# Patient Record
Sex: Male | Born: 1970 | Race: White | Hispanic: No | Smoking: Current every day smoker
Health system: Southern US, Community
[De-identification: ages and names within clinical notes are randomized; demographics above are authoritative.]

## PROBLEM LIST (undated history)

## (undated) DIAGNOSIS — Q249 Congenital malformation of heart, unspecified: Secondary | ICD-10-CM

## (undated) DIAGNOSIS — I471 Supraventricular tachycardia, unspecified: Secondary | ICD-10-CM

## (undated) DIAGNOSIS — F32A Depression, unspecified: Secondary | ICD-10-CM

## (undated) DIAGNOSIS — F419 Anxiety disorder, unspecified: Secondary | ICD-10-CM

## (undated) DIAGNOSIS — K5792 Diverticulitis of intestine, part unspecified, without perforation or abscess without bleeding: Secondary | ICD-10-CM

## (undated) DIAGNOSIS — F329 Major depressive disorder, single episode, unspecified: Secondary | ICD-10-CM

## (undated) HISTORY — DX: Depression, unspecified: F32.A

## (undated) HISTORY — DX: Congenital malformation of heart, unspecified: Q24.9

## (undated) HISTORY — DX: Major depressive disorder, single episode, unspecified: F32.9

## (undated) HISTORY — PX: FOOT SURGERY: SHX648

---

## 1998-10-07 ENCOUNTER — Emergency Department (HOSPITAL_COMMUNITY): Admission: EM | Admit: 1998-10-07 | Discharge: 1998-10-07 | Payer: Self-pay | Admitting: Emergency Medicine

## 2001-08-04 ENCOUNTER — Encounter: Payer: Self-pay | Admitting: Family Medicine

## 2001-08-04 ENCOUNTER — Encounter: Admission: RE | Admit: 2001-08-04 | Discharge: 2001-08-04 | Payer: Self-pay | Admitting: Family Medicine

## 2001-11-17 ENCOUNTER — Encounter (INDEPENDENT_AMBULATORY_CARE_PROVIDER_SITE_OTHER): Payer: Self-pay | Admitting: *Deleted

## 2001-11-17 ENCOUNTER — Ambulatory Visit (HOSPITAL_COMMUNITY): Admission: RE | Admit: 2001-11-17 | Discharge: 2001-11-17 | Payer: Self-pay | Admitting: Gastroenterology

## 2004-03-15 ENCOUNTER — Emergency Department (HOSPITAL_COMMUNITY): Admission: EM | Admit: 2004-03-15 | Discharge: 2004-03-15 | Payer: Self-pay | Admitting: Emergency Medicine

## 2005-09-22 ENCOUNTER — Emergency Department (HOSPITAL_COMMUNITY): Admission: EM | Admit: 2005-09-22 | Discharge: 2005-09-22 | Payer: Self-pay | Admitting: Emergency Medicine

## 2005-11-28 ENCOUNTER — Emergency Department (HOSPITAL_COMMUNITY): Admission: EM | Admit: 2005-11-28 | Discharge: 2005-11-28 | Payer: Self-pay | Admitting: Emergency Medicine

## 2006-04-23 ENCOUNTER — Emergency Department (HOSPITAL_COMMUNITY): Admission: EM | Admit: 2006-04-23 | Discharge: 2006-04-23 | Payer: Self-pay | Admitting: Family Medicine

## 2007-08-17 ENCOUNTER — Encounter: Admission: RE | Admit: 2007-08-17 | Discharge: 2007-08-17 | Payer: Self-pay | Admitting: Family Medicine

## 2007-08-20 ENCOUNTER — Encounter: Admission: RE | Admit: 2007-08-20 | Discharge: 2007-08-20 | Payer: Self-pay | Admitting: Family Medicine

## 2009-01-24 ENCOUNTER — Encounter: Admission: RE | Admit: 2009-01-24 | Discharge: 2009-01-24 | Payer: Self-pay | Admitting: Family Medicine

## 2010-03-24 ENCOUNTER — Emergency Department (HOSPITAL_COMMUNITY)
Admission: EM | Admit: 2010-03-24 | Discharge: 2010-03-25 | Payer: Self-pay | Source: Home / Self Care | Admitting: Emergency Medicine

## 2010-03-30 LAB — POCT I-STAT, CHEM 8
BUN: 14 mg/dL (ref 6–23)
Calcium, Ion: 1.24 mmol/L (ref 1.12–1.32)
Chloride: 106 mEq/L (ref 96–112)
Creatinine, Ser: 1.1 mg/dL (ref 0.4–1.5)
Glucose, Bld: 86 mg/dL (ref 70–99)
HCT: 47 % (ref 39.0–52.0)
Hemoglobin: 16 g/dL (ref 13.0–17.0)
Potassium: 3.9 mEq/L (ref 3.5–5.1)
Sodium: 142 mEq/L (ref 135–145)
TCO2: 26 mmol/L (ref 0–100)

## 2010-03-30 LAB — POCT CARDIAC MARKERS
CKMB, poc: <1 ng/mL — ABNORMAL LOW (ref 1.0–8.0)
Myoglobin, poc: 41.6 ng/mL (ref 12–200)
Troponin i, poc: <0.05 ng/mL (ref 0.00–0.09)

## 2010-05-30 ENCOUNTER — Emergency Department (HOSPITAL_COMMUNITY)
Admission: EM | Admit: 2010-05-30 | Discharge: 2010-05-30 | Disposition: A | Discharge status: Home / Self Care | Payer: Managed Care, Other (non HMO) | Attending: Emergency Medicine | Admitting: Emergency Medicine

## 2010-05-30 ENCOUNTER — Emergency Department (HOSPITAL_COMMUNITY): Payer: Managed Care, Other (non HMO)

## 2010-05-30 DIAGNOSIS — K219 Gastro-esophageal reflux disease without esophagitis: Secondary | ICD-10-CM | POA: Insufficient documentation

## 2010-05-30 DIAGNOSIS — M542 Cervicalgia: Secondary | ICD-10-CM | POA: Insufficient documentation

## 2010-05-30 DIAGNOSIS — R002 Palpitations: Secondary | ICD-10-CM | POA: Insufficient documentation

## 2010-05-30 DIAGNOSIS — Z79899 Other long term (current) drug therapy: Secondary | ICD-10-CM | POA: Insufficient documentation

## 2010-05-30 DIAGNOSIS — R6884 Jaw pain: Secondary | ICD-10-CM | POA: Insufficient documentation

## 2010-05-30 DIAGNOSIS — R000 Tachycardia, unspecified: Secondary | ICD-10-CM | POA: Insufficient documentation

## 2010-05-30 DIAGNOSIS — F411 Generalized anxiety disorder: Secondary | ICD-10-CM | POA: Insufficient documentation

## 2010-05-30 DIAGNOSIS — R079 Chest pain, unspecified: Secondary | ICD-10-CM | POA: Insufficient documentation

## 2010-05-30 LAB — CBC
HCT: 43.6 % (ref 39.0–52.0)
Hemoglobin: 15.2 g/dL (ref 13.0–17.0)
MCH: 30.7 pg (ref 26.0–34.0)
MCHC: 34.9 g/dL (ref 30.0–36.0)
MCV: 88.1 fL (ref 78.0–100.0)
Platelets: 191 10*3/uL (ref 150–400)
RBC: 4.95 MIL/uL (ref 4.22–5.81)
RDW: 12.4 % (ref 11.5–15.5)
WBC: 6.9 10*3/uL (ref 4.0–10.5)

## 2010-05-30 LAB — COMPREHENSIVE METABOLIC PANEL
ALT: 30 U/L (ref 0–53)
AST: 19 U/L (ref 0–37)
Albumin: 3.7 g/dL (ref 3.5–5.2)
Alkaline Phosphatase: 61 U/L (ref 39–117)
BUN: 14 mg/dL (ref 6–23)
CO2: 23 mEq/L (ref 19–32)
Calcium: 8.8 mg/dL (ref 8.4–10.5)
Chloride: 109 mEq/L (ref 96–112)
Creatinine, Ser: 1.07 mg/dL (ref 0.4–1.5)
GFR calc Af Amer: >60 mL/min (ref >60)
GFR calc non Af Amer: >60 mL/min (ref >60)
Glucose, Bld: 137 mg/dL — ABNORMAL HIGH (ref 70–99)
Potassium: 3.9 mEq/L (ref 3.5–5.1)
Sodium: 137 mEq/L (ref 135–145)
Total Bilirubin: 0.5 mg/dL (ref 0.3–1.2)
Total Protein: 6.7 g/dL (ref 6.0–8.3)

## 2010-05-30 LAB — POCT CARDIAC MARKERS
CKMB, poc: <1 ng/mL — ABNORMAL LOW (ref 1.0–8.0)
CKMB, poc: <1 ng/mL — ABNORMAL LOW (ref 1.0–8.0)
CKMB, poc: <1 ng/mL — ABNORMAL LOW (ref 1.0–8.0)
Myoglobin, poc: 45.2 ng/mL (ref 12–200)
Myoglobin, poc: 31.9 ng/mL (ref 12–200)
Myoglobin, poc: 52 ng/mL (ref 12–200)
Troponin i, poc: <0.05 ng/mL (ref 0.00–0.09)
Troponin i, poc: <0.05 ng/mL (ref 0.00–0.09)
Troponin i, poc: <0.05 ng/mL (ref 0.00–0.09)

## 2010-06-15 ENCOUNTER — Emergency Department (HOSPITAL_BASED_OUTPATIENT_CLINIC_OR_DEPARTMENT_OTHER)
Admission: EM | Admit: 2010-06-15 | Discharge: 2010-06-15 | Disposition: A | Discharge status: Home / Self Care | Payer: Managed Care, Other (non HMO) | Attending: Emergency Medicine | Admitting: Emergency Medicine

## 2010-06-15 DIAGNOSIS — K219 Gastro-esophageal reflux disease without esophagitis: Secondary | ICD-10-CM | POA: Insufficient documentation

## 2010-06-15 DIAGNOSIS — L02419 Cutaneous abscess of limb, unspecified: Secondary | ICD-10-CM | POA: Insufficient documentation

## 2010-07-31 NOTE — Op Note (Signed)
NAME:  Jim Hatfield, Jim Hatfield                          ACCOUNT NO.:  [id number]   MEDICAL RECORD NO.:  [id number]                   PATIENT TYPE:  AMB   LOCATION:  ENDO                                 FACILITY:  [facility]   PHYSICIAN:  Charolett Bumpers, M.D.             DATE OF BIRTH:  1971-02-26   DATE OF PROCEDURE:  DATE OF DISCHARGE:  11/17/2001                                 OPERATIVE REPORT   PROCEDURE INDICATION:  The patient is a 40 year-old male born 11-26-1970.  The patient has a four month history of left lower quadrant abdominal pain  (burning and cramps) associated with non-bloody diarrhea.   On Aug 04, 2001 he underwent a CT scan of the abdomen and pelvis which  revealed focal inflammatory changes involving the proximal sigmoid colon  associated with diverticulosis.  The patient has received approximately  three courses of Cipro and Flagyl without completely resolving his  gastrointestinal symptoms.  He is scheduled to undergo a diagnostic  colonoscopy to rule out inflammatory bowel disease.   MEDICATION ALLERGIES:  None.   CHRONIC MEDICATIONS:  None.   PAST MEDICAL HISTORY:  Fractured foot, 1991.   HABITS:  The patient has a one pack per day cigarette smoking habit and does  not consume alcohol.   FAMILY HISTORY:  Negative for inflammatory bowel disease and colorectal  neoplasia.   SOCIAL HISTORY:  The patient is married.  His wife is in good health.  His  two sons and one daughter are in good health.   ENDOSCOPIST:  Charolett Bumpers, M.D.   PREMEDICATION:  Fentanyl 50 mcg and Versed 10 mg.   ENDOSCOPE:  Olympus pediatric colonoscope.   PROCEDURE:  After obtaining informed consent the patient was placed in the  left lateral decubitus position.  I administered intravenous Fentanyl and  intravenous Versed to achieve conscious sedation for the procedure.  The  patient's blood pressure, oxygen saturation and cardiac rhythm were  monitored throughout the procedure  and documented in the medical record.   Anal inspection is normal.  Digital rectal exam was normal.  The Olympus  pediatric video colonoscope was introduced into the rectum and easily  advanced to the cecum.  A normal appearing ileocecal valve was intubated and  the distal ileum inspected.  Colonic preparation for the exam today was  excellent.   Rectum:  Normal.   Sigmoid colon:  Small diverticula are noted.  The mucosa in the sigmoid  colon is red, but is not friable and there are no ulcers.  The sigmoid  mucosa was biopsied multiple times.   Descending colon:  Normal.   Splenic flexure:  Normal.   Transverse colon:  Normal.   Hepatic flexure:  Normal.   Ascending colon:  Normal.   Cecum and ileocecal valve:  Normal.   Distal ileum:  Normal.   ASSESSMENT:  Sigmoid colonic diverticulosis; otherwise normal  proctocolonoscopy  to the cecum with distal ileoscopy.  Sigmoid colonic  biopsies are pending.                                               Charolett Bumpers, M.D.    MKJ/MEDQ  D:  11/17/2001  T:  11/19/2001  Job:  [id number]   cc:   Bryan Lemma. Manus Gunning, M.D.  [address] [place]  [place] [address]  Fax: [phone]

## 2013-01-25 ENCOUNTER — Emergency Department (HOSPITAL_COMMUNITY): Payer: Self-pay

## 2013-01-25 ENCOUNTER — Encounter (HOSPITAL_COMMUNITY): Payer: Self-pay | Admitting: Emergency Medicine

## 2013-01-25 ENCOUNTER — Emergency Department (HOSPITAL_COMMUNITY)
Admission: EM | Admit: 2013-01-25 | Discharge: 2013-01-25 | Disposition: A | Discharge status: Home / Self Care | Payer: Self-pay | Attending: Emergency Medicine | Admitting: Emergency Medicine

## 2013-01-25 DIAGNOSIS — F17200 Nicotine dependence, unspecified, uncomplicated: Secondary | ICD-10-CM | POA: Insufficient documentation

## 2013-01-25 DIAGNOSIS — F411 Generalized anxiety disorder: Secondary | ICD-10-CM | POA: Insufficient documentation

## 2013-01-25 DIAGNOSIS — R002 Palpitations: Secondary | ICD-10-CM | POA: Insufficient documentation

## 2013-01-25 HISTORY — DX: Anxiety disorder, unspecified: F41.9

## 2013-01-25 LAB — BASIC METABOLIC PANEL
BUN: 12 mg/dL (ref 6–23)
CO2: 26 mEq/L (ref 19–32)
Calcium: 10.2 mg/dL (ref 8.4–10.5)
Chloride: 103 mEq/L (ref 96–112)
Creatinine, Ser: 1.01 mg/dL (ref 0.50–1.35)
GFR calc Af Amer: >90 mL/min (ref >90)
GFR calc non Af Amer: 89 mL/min — ABNORMAL LOW (ref >90)
Glucose, Bld: 98 mg/dL (ref 70–99)
Potassium: 3.8 mEq/L (ref 3.5–5.1)
Sodium: 139 mEq/L (ref 135–145)

## 2013-01-25 LAB — CBC WITH DIFFERENTIAL/PLATELET
Basophils Absolute: 0.1 10*3/uL (ref 0.0–0.1)
Basophils Relative: 1 % (ref 0–1)
Eosinophils Absolute: 0.4 10*3/uL (ref 0.0–0.7)
Eosinophils Relative: 3 % (ref 0–5)
HCT: 46.9 % (ref 39.0–52.0)
Hemoglobin: 16.5 g/dL (ref 13.0–17.0)
Lymphocytes Relative: 20 % (ref 12–46)
Lymphs Abs: 2.3 10*3/uL (ref 0.7–4.0)
MCH: 32.2 pg (ref 26.0–34.0)
MCHC: 35.2 g/dL (ref 30.0–36.0)
MCV: 91.6 fL (ref 78.0–100.0)
Monocytes Absolute: 0.8 10*3/uL (ref 0.1–1.0)
Monocytes Relative: 7 % (ref 3–12)
Neutro Abs: 7.8 10*3/uL — ABNORMAL HIGH (ref 1.7–7.7)
Neutrophils Relative %: 69 % (ref 43–77)
Platelets: 202 10*3/uL (ref 150–400)
RBC: 5.12 MIL/uL (ref 4.22–5.81)
RDW: 12.3 % (ref 11.5–15.5)
WBC: 11.4 10*3/uL — ABNORMAL HIGH (ref 4.0–10.5)

## 2013-01-25 LAB — POCT I-STAT TROPONIN I: Troponin i, poc: 0 ng/mL (ref 0.00–0.08)

## 2013-01-25 MED ORDER — DIAZEPAM 5 MG PO TABS
5.0000 mg | ORAL_TABLET | Freq: Once | ORAL | Status: AC
  Administered 2013-01-25: 5 mg via ORAL
  Filled 2013-01-25: qty 1

## 2013-01-25 NOTE — ED Provider Notes (Signed)
CSN: [id number]     Arrival date & time 01/25/13  1911 History   First MD Initiated Contact with Patient 01/25/13 1938     Chief Complaint  Patient presents with  . Tachycardia   (Consider location/radiation/quality/duration/timing/severity/associated sxs/prior Treatment) The history is provided by the patient and medical records.   This is a 42 year old male with past medical history significant for anxiety and SVT, presenting to the ED for tachycardia patient states he was sitting at home watching television when he had sudden onset of flushed feeling across his face and palpitations. States he checked his blood pressure using his home machine and pulse was noted to be 198, called EMS. Patient states he took 2 of his 10 mg Valium prior to EMS arrival. On EMS arrival, heart rate was 104 and blood pressure was 140/96.  Patient notes he has been having some intermittent chest tightness over the past 3 days. States he originally attributed this to his anxiety, and often times he has a hard time distinguishing between anxiety and possible SVT. Patient's last evaluation with cardiology was several years ago, states after diagnosis with SVT by event monitor, he had multiple stress tests all of which were negative and he was discharged from the practice.  On arrival to the ED, vital signs are stable patient alert and oriented, no acute distress.  Past Medical History  Diagnosis Date  . Anxiety    History reviewed. No pertinent past surgical history. History reviewed. No pertinent family history. History  Substance Use Topics  . Smoking status: Current Every Day Smoker  . Smokeless tobacco: Never Used  . Alcohol Use: Yes    Review of Systems  Cardiovascular: Positive for palpitations.  All other systems reviewed and are negative.    Allergies  Ativan  Home Medications  No current outpatient prescriptions on file. BP 142/93  Pulse 92  Temp(Src) 98.3 F (36.8 C) (Oral)  Resp 14  SpO2  97%  Physical Exam  Nursing note and vitals reviewed. Constitutional: He is oriented to person, place, and time. He appears well-developed and well-nourished. No distress.  HENT:  Head: Normocephalic and atraumatic.  Mouth/Throat: Oropharynx is clear and moist.  Eyes: Conjunctivae and EOM are normal. Pupils are equal, round, and reactive to light.  Neck: Normal range of motion. Neck supple.  Cardiovascular: Normal rate, regular rhythm and normal heart sounds.   Pulmonary/Chest: Effort normal and breath sounds normal. No respiratory distress. He has no wheezes.  Abdominal: Soft. Bowel sounds are normal. There is no tenderness. There is no guarding.  Musculoskeletal: Normal range of motion. He exhibits no edema.  Neurological: He is alert and oriented to person, place, and time.  Skin: Skin is warm and dry. He is not diaphoretic.  Psychiatric: His mood appears anxious.  Appears somewhat anxious    ED Course  Procedures (including critical care time)   Date: 01/25/2013  Rate: 94  Rhythm: normal sinus rhythm  QRS Axis: normal  Intervals: normal  ST/T Wave abnormalities: normal  Conduction Disutrbances:none  Narrative Interpretation:   Old EKG Reviewed: unchanged   Labs Review Labs Reviewed  CBC WITH DIFFERENTIAL - Abnormal; Notable for the following:    WBC 11.4 (*)    Neutro Abs 7.8 (*)    All other components within normal limits  BASIC METABOLIC PANEL - Abnormal; Notable for the following:    GFR calc non Af Amer 89 (*)    All other components within normal limits  POCT I-STAT TROPONIN I  Imaging Review Dg Chest 2 View  01/25/2013   CLINICAL DATA:  Chest pain. Shortness of breath. Tachycardia. Smoker.  EXAM: CHEST  2 VIEW  COMPARISON:  05/30/2010.  FINDINGS: The cardiac silhouette remains near the upper limit of normal in size. Clear lungs. Mild diffuse peribronchial thickening without significant change. Minimal scoliosis. Mild thoracic spine degenerative changes.   IMPRESSION: Stable borderline cardiomegaly and mild chronic bronchitic changes. No acute abnormality.   Electronically Signed   By: Gordan Payment M.D.   On: 01/25/2013 21:27    EKG Interpretation   None       MDM   1. Palpitations    Rhythm strips from EMS reviewed-- sinus tach with rate of 101, no ST elevation.  EKG in the ED NSR, no acute ischemic changes.  Trop negative.  CXR clear.  Labs largely WNL.  Pt has remained in NSR during entire ED stay.  Pt was re-evaluated, states he feels fine but is fearful that he will have another SVT episode.  I will refer him to back to cardiology for repeat evaluation if desired.  Discussed lab and imaging results with pt and family, they acknowledged understanding and agreed.  Strict return precautions advised should sx change or worsen.  VS stable at time of discharge. Filed Vitals:   01/25/13 2131  BP: 125/79  Pulse: 79  Temp:   Resp: 12     Garlon Hatchet, PA-C 01/26/13 0001

## 2013-01-25 NOTE — ED Notes (Signed)
Pt reports taking a 10mg  dose of Valium around 1820

## 2013-01-25 NOTE — ED Notes (Signed)
Per EMS: pt coming from home with c/o tachycardia. Pt reports at 1815 a sudden onset of feeling flush from the chest up, home BP reported pulse of 198. Pt took valium x 2 prior to EMS arrival. Upon EMS arrival pt's skin was warm and dry, 104HR NSR, 140/96, 98% RA, A&Ox4, equal and unlabored. Pt reports intermittent chest tightness over the past three days.

## 2013-01-28 NOTE — ED Provider Notes (Signed)
Medical screening examination/treatment/procedure(s) were performed by non-physician practitioner and as supervising physician I was immediately available for consultation/collaboration.  EKG Interpretation     Ventricular Rate:  94 PR Interval:  152 QRS Duration: 87 QT Interval:  320 QTC Calculation: 400 R Axis:   53 Text Interpretation:  Sinus rhythm ED PHYSICIAN INTERPRETATION AVAILABLE IN [facility]              Malena Timpone B. Bernette Mayers, MD 01/28/13 [phone]

## 2013-03-01 ENCOUNTER — Emergency Department (HOSPITAL_COMMUNITY): Payer: Self-pay

## 2013-03-01 ENCOUNTER — Emergency Department (HOSPITAL_COMMUNITY)
Admission: EM | Admit: 2013-03-01 | Discharge: 2013-03-01 | Disposition: A | Discharge status: Home / Self Care | Payer: Self-pay | Attending: Emergency Medicine | Admitting: Emergency Medicine

## 2013-03-01 ENCOUNTER — Encounter (HOSPITAL_COMMUNITY): Payer: Self-pay | Admitting: Emergency Medicine

## 2013-03-01 DIAGNOSIS — F17200 Nicotine dependence, unspecified, uncomplicated: Secondary | ICD-10-CM | POA: Insufficient documentation

## 2013-03-01 DIAGNOSIS — Z792 Long term (current) use of antibiotics: Secondary | ICD-10-CM | POA: Insufficient documentation

## 2013-03-01 DIAGNOSIS — R000 Tachycardia, unspecified: Secondary | ICD-10-CM | POA: Insufficient documentation

## 2013-03-01 DIAGNOSIS — K5732 Diverticulitis of large intestine without perforation or abscess without bleeding: Secondary | ICD-10-CM | POA: Insufficient documentation

## 2013-03-01 DIAGNOSIS — R002 Palpitations: Secondary | ICD-10-CM | POA: Insufficient documentation

## 2013-03-01 DIAGNOSIS — Z8679 Personal history of other diseases of the circulatory system: Secondary | ICD-10-CM | POA: Insufficient documentation

## 2013-03-01 DIAGNOSIS — K5792 Diverticulitis of intestine, part unspecified, without perforation or abscess without bleeding: Secondary | ICD-10-CM

## 2013-03-01 DIAGNOSIS — F411 Generalized anxiety disorder: Secondary | ICD-10-CM | POA: Insufficient documentation

## 2013-03-01 DIAGNOSIS — Z79899 Other long term (current) drug therapy: Secondary | ICD-10-CM | POA: Insufficient documentation

## 2013-03-01 HISTORY — DX: Supraventricular tachycardia: I47.1

## 2013-03-01 HISTORY — DX: Diverticulitis of intestine, part unspecified, without perforation or abscess without bleeding: K57.92

## 2013-03-01 HISTORY — DX: Supraventricular tachycardia, unspecified: I47.10

## 2013-03-01 LAB — CBC WITH DIFFERENTIAL/PLATELET
Basophils Absolute: 0.1 10*3/uL (ref 0.0–0.1)
Basophils Relative: 1 % (ref 0–1)
Eosinophils Relative: 2 % (ref 0–5)
HCT: 46 % (ref 39.0–52.0)
Hemoglobin: 16.2 g/dL (ref 13.0–17.0)
Lymphocytes Relative: 16 % (ref 12–46)
Lymphs Abs: 1.6 10*3/uL (ref 0.7–4.0)
MCV: 89.7 fL (ref 78.0–100.0)
Monocytes Absolute: 0.8 10*3/uL (ref 0.1–1.0)
Monocytes Relative: 7 % (ref 3–12)
Neutro Abs: 7.6 10*3/uL (ref 1.7–7.7)
Neutrophils Relative %: 75 % (ref 43–77)
RBC: 5.13 MIL/uL (ref 4.22–5.81)
WBC: 10.2 10*3/uL (ref 4.0–10.5)

## 2013-03-01 LAB — COMPREHENSIVE METABOLIC PANEL
ALT: 17 U/L (ref 0–53)
AST: 12 U/L (ref 0–37)
Albumin: 4 g/dL (ref 3.5–5.2)
Alkaline Phosphatase: 81 U/L (ref 39–117)
BUN: 12 mg/dL (ref 6–23)
CO2: 23 mEq/L (ref 19–32)
Chloride: 105 mEq/L (ref 96–112)
Creatinine, Ser: 0.92 mg/dL (ref 0.50–1.35)
GFR calc non Af Amer: >90 mL/min (ref >90)
Glucose, Bld: 100 mg/dL — ABNORMAL HIGH (ref 70–99)
Potassium: 3.8 mEq/L (ref 3.5–5.1)
Total Bilirubin: 0.3 mg/dL (ref 0.3–1.2)

## 2013-03-01 LAB — LIPASE, BLOOD: Lipase: 24 U/L (ref 11–59)

## 2013-03-01 MED ORDER — SODIUM CHLORIDE 0.9 % IV BOLUS (SEPSIS)
1000.0000 mL | Freq: Once | INTRAVENOUS | Status: AC
  Administered 2013-03-01: 1000 mL via INTRAVENOUS

## 2013-03-01 MED ORDER — ONDANSETRON HCL 4 MG PO TABS: 4.0000 mg | ORAL_TABLET | Freq: Four times a day (QID) | ORAL | Status: DC

## 2013-03-01 MED ORDER — METRONIDAZOLE 500 MG PO TABS
500.0000 mg | ORAL_TABLET | Freq: Once | ORAL | Status: AC
  Administered 2013-03-01: 500 mg via ORAL
  Filled 2013-03-01: qty 1

## 2013-03-01 MED ORDER — CIPROFLOXACIN HCL 500 MG PO TABS
500.0000 mg | ORAL_TABLET | Freq: Once | ORAL | Status: AC
  Administered 2013-03-01: 500 mg via ORAL
  Filled 2013-03-01: qty 1

## 2013-03-01 MED ORDER — CIPROFLOXACIN HCL 500 MG PO TABS: 500.0000 mg | ORAL_TABLET | Freq: Two times a day (BID) | ORAL | Status: DC

## 2013-03-01 MED ORDER — MORPHINE SULFATE 4 MG/ML IJ SOLN
4.0000 mg | Freq: Once | INTRAMUSCULAR | Status: AC
  Administered 2013-03-01: 4 mg via INTRAVENOUS
  Filled 2013-03-01: qty 1

## 2013-03-01 MED ORDER — IOHEXOL 300 MG/ML  SOLN
50.0000 mL | Freq: Once | INTRAMUSCULAR | Status: AC | PRN
  Administered 2013-03-01: 50 mL via ORAL

## 2013-03-01 MED ORDER — DIAZEPAM 5 MG/ML IJ SOLN
5.0000 mg | Freq: Once | INTRAMUSCULAR | Status: AC
  Administered 2013-03-01: 5 mg via INTRAVENOUS
  Filled 2013-03-01: qty 2

## 2013-03-01 MED ORDER — OXYCODONE-ACETAMINOPHEN 5-325 MG PO TABS: 1.0000 | ORAL_TABLET | Freq: Four times a day (QID) | ORAL | Status: DC | PRN

## 2013-03-01 MED ORDER — METRONIDAZOLE 500 MG PO TABS: 500.0000 mg | ORAL_TABLET | Freq: Three times a day (TID) | ORAL | Status: DC

## 2013-03-01 MED ORDER — ONDANSETRON HCL 4 MG/2ML IJ SOLN
4.0000 mg | Freq: Once | INTRAMUSCULAR | Status: AC
  Administered 2013-03-01: 4 mg via INTRAVENOUS
  Filled 2013-03-01: qty 2

## 2013-03-01 MED ORDER — IOHEXOL 300 MG/ML  SOLN
100.0000 mL | Freq: Once | INTRAMUSCULAR | Status: AC | PRN
  Administered 2013-03-01: 100 mL via INTRAVENOUS

## 2013-03-01 NOTE — ED Notes (Signed)
Pt states he has had several episodes of SVT over past few months. Pt states he is having palpitations again. HR varies between 110 and 145 in triage. Pt alert and oriented. Pt denies lightheadedness. Also has had constipated for past 2 weeks.

## 2013-03-01 NOTE — ED Provider Notes (Signed)
Medical screening examination/treatment/procedure(s) were performed by non-physician practitioner and as supervising physician I was immediately available for consultation/collaboration.  EKG Interpretation    Date/Time:  Thursday March 01 2013 18:10:04 EST Ventricular Rate:  107 PR Interval:  151 QRS Duration: 87 QT Interval:  307 QTC Calculation: 409 R Axis:   76 Text Interpretation:  Sinus tachycardia Borderline repolarization abnormality Baseline wander in lead(s) I III aVL No significant change was found Confirmed by CAMPOS  MD, KEVIN ([id number]) on 03/01/2013 6:44:42 PM              Richardean Canal, MD 03/01/13 [phone]

## 2013-03-01 NOTE — ED Notes (Signed)
Patient is alert and oriented x3.  He was given DC instructions and follow up visit instructions.  Patient gave verbal understanding.  He was DC ambulatory under his own power to home.  V/S stable.  He was not showing any signs of distress on DC 

## 2013-03-01 NOTE — ED Provider Notes (Signed)
CSN: [id number]     Arrival date & time 03/01/13  1806 History   First MD Initiated Contact with Patient 03/01/13 1835     Chief Complaint  Patient presents with  . Palpitations  . Diverticulitis   (Consider location/radiation/quality/duration/timing/severity/associated sxs/prior Treatment) HPI  Patient presents to the ED complaining of RLQ abdominal pain and anxiety/palpiations. He states that he had SVT 5 years ago and sometimes when he has panic attacks he can't tell if its the panic or the SVT. He is feeling anxious right now and has not taken his valium today. He says that his LLQ has been bothering him for the past two weeks and becoming increasingly worse. He has known diverticulosis but also reports having bought's of diverticulitis. Reports this feels just like the same. Denies nausea, vomiting, and fevers. + constipation.  Past Medical History  Diagnosis Date  . Anxiety   . Diverticulitis   . SVT (supraventricular tachycardia)    History reviewed. No pertinent past surgical history. History reviewed. No pertinent family history. History  Substance Use Topics  . Smoking status: Current Every Day Smoker  . Smokeless tobacco: Never Used  . Alcohol Use: Yes    Review of Systems The patient denies anorexia, fever, weight loss,, vision loss, decreased hearing, hoarseness, chest pain, syncope, dyspnea on exertion, peripheral edema, balance deficits, hemoptysis, , melena, hematochezia, severe indigestion/heartburn, hematuria, incontinence, genital sores, muscle weakness, suspicious skin lesions, transient blindness, difficulty walking, depression, unusual weight change, abnormal bleeding, enlarged lymph nodes, angioedema, and breast masses.  Allergies  Ativan  Home Medications   Current Outpatient Rx  Name  Route  Sig  Dispense  Refill  . clonazePAM (KLONOPIN) 0.5 MG tablet   Oral   Take 0.5 mg by mouth 3 (three) times daily as needed for anxiety.         . diazepam  (VALIUM) 10 MG tablet   Oral   Take 10 mg by mouth every 12 (twelve) hours as needed for anxiety.          Marland Kitchen omeprazole (PRILOSEC) 20 MG capsule   Oral   Take 20 mg by mouth daily.         . ciprofloxacin (CIPRO) 500 MG tablet   Oral   Take 1 tablet (500 mg total) by mouth 2 (two) times daily.   20 tablet   0   . metroNIDAZOLE (FLAGYL) 500 MG tablet   Oral   Take 1 tablet (500 mg total) by mouth 3 (three) times daily.   30 tablet   0   . ondansetron (ZOFRAN) 4 MG tablet   Oral   Take 1 tablet (4 mg total) by mouth every 6 (six) hours.   12 tablet   0   . oxyCODONE-acetaminophen (PERCOCET/ROXICET) 5-325 MG per tablet   Oral   Take 1 tablet by mouth every 6 (six) hours as needed for severe pain.   15 tablet   0    BP 140/92  Pulse 95  Temp(Src) 98.3 F (36.8 C) (Oral)  Resp 17  SpO2 97% Physical Exam  Nursing note and vitals reviewed. Constitutional: He appears well-developed and well-nourished. No distress.  HENT:  Head: Normocephalic and atraumatic.  Eyes: Pupils are equal, round, and reactive to light.  Neck: Normal range of motion. Neck supple.  Cardiovascular: Regular rhythm.  Tachycardia present.   Pulmonary/Chest: Effort normal.  Abdominal: Soft. There is tenderness in the left lower quadrant. There is guarding. There is no rigidity, no rebound, no  CVA tenderness, no tenderness at McBurney's point and negative Murphy's sign.    Neurological: He is alert.  Skin: Skin is warm and dry.    ED Course  Procedures (including critical care time) Labs Review Labs Reviewed  COMPREHENSIVE METABOLIC PANEL - Abnormal; Notable for the following:    Glucose, Bld 100 (*)    All other components within normal limits  CBC WITH DIFFERENTIAL  LIPASE, BLOOD   Imaging Review Ct Abdomen Pelvis W Contrast  03/01/2013   CLINICAL DATA:  Left lower quadrant pain question diverticulitis  EXAM: CT ABDOMEN AND PELVIS WITH CONTRAST  TECHNIQUE: Multidetector CT imaging of  the abdomen and pelvis was performed using the standard protocol following bolus administration of intravenous contrast.  CONTRAST:  50mL OMNIPAQUE IOHEXOL 300 MG/ML SOLN orally, OMNIPAQUE IOHEXOL 300 MG/ML SOLN IV  COMPARISON:  01/24/2009  FINDINGS: Minimal dependent atelectasis left lower lobe.  Small hepatic cyst superiorly 10 mm diameter image 11 unchanged.  Liver, spleen, pancreas, and kidneys normal appearance.  Small nodules in the adrenal glands bilaterally likely adenomas, largest 13 mm diameter on right, image 23.  Diverticulosis of the descending and sigmoid colon with minimal focal wall thickening and pericolic inflammatory changes at the proximal sigmoid colon compatible with mild acute diverticulitis.  No evidence of perforation or abscess.  Normal appendix.  Unremarkable bladder and ureters.  Stomach and bowel loops otherwise normal appearance.  No mass, adenopathy, free fluid or hernia.  Osseous structures unremarkable.  IMPRESSION: Mild acute diverticulitis at the proximal sigmoid colon.  Tiny bilateral probable adrenal adenomas.   Electronically Signed   By: Ulyses Southward M.D.   On: 03/01/2013 21:38    EKG Interpretation    Date/Time:  Thursday March 01 2013 18:10:04 EST Ventricular Rate:  107 PR Interval:  151 QRS Duration: 87 QT Interval:  307 QTC Calculation: 409 R Axis:   76 Text Interpretation:  Sinus tachycardia Borderline repolarization abnormality Baseline wander in lead(s) I III aVL No significant change was found Confirmed by CAMPOS  MD, KEVIN ([id number]) on 03/01/2013 6:44:42 PM            MDM   1. Diverticulitis      Patient hydrated, pain medication given Labs and CT abd/pelv pending.  Labs are reassuring and unremarkable CT scan of the abdomen shows mild acute diverticulitis.  Pain has been managed in the ED, pt looks well and vitals are stable. He is a good candidate to be treated at home. Pt is agreeable and comfortable with plan.   42  y.o.Jim Hatfield's evaluation in the Emergency Department is complete. It has been determined that no acute conditions requiring further emergency intervention are present at this time. The patient/guardian have been advised of the diagnosis and plan. We have discussed signs and symptoms that warrant return to the ED, such as changes or worsening in symptoms.  Vital signs are stable at discharge. Filed Vitals:   03/01/13 1849  BP: 140/92  Pulse: 95  Temp:   Resp: 17    Patient/guardian has voiced understanding and agreed to follow-up with the PCP or specialist. \    Dorthula Matas, PA-C 03/01/13 2156

## 2014-10-17 ENCOUNTER — Emergency Department (HOSPITAL_COMMUNITY)
Admission: EM | Admit: 2014-10-17 | Discharge: 2014-10-17 | Disposition: A | Discharge status: Home / Self Care | Payer: Managed Care, Other (non HMO) | Attending: Physician Assistant | Admitting: Physician Assistant

## 2014-10-17 ENCOUNTER — Emergency Department (HOSPITAL_COMMUNITY): Payer: Managed Care, Other (non HMO)

## 2014-10-17 ENCOUNTER — Encounter (HOSPITAL_COMMUNITY): Payer: Self-pay | Admitting: Emergency Medicine

## 2014-10-17 DIAGNOSIS — I493 Ventricular premature depolarization: Secondary | ICD-10-CM

## 2014-10-17 DIAGNOSIS — F419 Anxiety disorder, unspecified: Secondary | ICD-10-CM | POA: Insufficient documentation

## 2014-10-17 DIAGNOSIS — Z8719 Personal history of other diseases of the digestive system: Secondary | ICD-10-CM | POA: Insufficient documentation

## 2014-10-17 DIAGNOSIS — Z720 Tobacco use: Secondary | ICD-10-CM | POA: Insufficient documentation

## 2014-10-17 LAB — I-STAT TROPONIN, ED: Troponin i, poc: 0 ng/mL (ref 0.00–0.08)

## 2014-10-17 LAB — BASIC METABOLIC PANEL
Anion gap: 6 (ref 5–15)
BUN: 15 mg/dL (ref 6–20)
CO2: 25 mmol/L (ref 22–32)
Calcium: 9.2 mg/dL (ref 8.9–10.3)
Chloride: 107 mmol/L (ref 101–111)
Creatinine, Ser: 1.03 mg/dL (ref 0.61–1.24)
GFR calc non Af Amer: >60 mL/min (ref >60)
GLUCOSE: 157 mg/dL — AB (ref 65–99)
POTASSIUM: 3.8 mmol/L (ref 3.5–5.1)
SODIUM: 138 mmol/L (ref 135–145)

## 2014-10-17 LAB — CBC
HCT: 46.1 % (ref 39.0–52.0)
HEMOGLOBIN: 15.5 g/dL (ref 13.0–17.0)
MCH: 31.1 pg (ref 26.0–34.0)
MCHC: 33.6 g/dL (ref 30.0–36.0)
MCV: 92.6 fL (ref 78.0–100.0)
Platelets: 181 10*3/uL (ref 150–400)
RBC: 4.98 MIL/uL (ref 4.22–5.81)
RDW: 12.9 % (ref 11.5–15.5)
WBC: 8.1 10*3/uL (ref 4.0–10.5)

## 2014-10-17 MED ORDER — NITROGLYCERIN 0.4 MG SL SUBL
0.4000 mg | SUBLINGUAL_TABLET | SUBLINGUAL | Status: DC | PRN
Start: 2014-10-17 — End: 2014-10-17

## 2014-10-17 MED ORDER — ASPIRIN 81 MG PO CHEW
324.0000 mg | CHEWABLE_TABLET | Freq: Once | ORAL | Status: AC
  Administered 2014-10-17: 324 mg via ORAL
  Filled 2014-10-17: qty 4

## 2014-10-17 MED ORDER — DIAZEPAM 5 MG/ML IJ SOLN
2.5000 mg | Freq: Once | INTRAMUSCULAR | Status: AC
  Administered 2014-10-17: 2.5 mg via INTRAVENOUS
  Filled 2014-10-17: qty 2

## 2014-10-17 NOTE — ED Provider Notes (Signed)
CSN: [id number]     Arrival date & time 10/17/14  1054 History   First MD Initiated Contact with Patient 10/17/14 1056     Chief Complaint  Patient presents with  . Palpitations     (Consider location/radiation/quality/duration/timing/severity/associated sxs/prior Treatment) HPI Comments: Jim Hatfield, 44 y/o male with history of anxiety and SVT presents with palpitations. The palpitations began about an hour ago. He has a history of developing palpitations, becoming very anxious and occasionally having anxiety attacks, and developing SVT. He does not think it is related to exertion. This time, he attempted a valsalva maneuver without relief and took the valium prescribed to him for anxiety. He states it is a viscous cycle and he is fighting off an anxiety attack right now. He denies chest pain, nausea, headaches, dizziness, fevers, chills.   Patient is a 44 y.o. male presenting with palpitations. The history is provided by the patient.  Palpitations Palpitations quality:  Fast Onset quality:  Sudden Duration:  1 hour Chronicity:  Recurrent Context: anxiety   Relieved by:  Nothing Ineffective treatments:  Valsalva   Past Medical History  Diagnosis Date  . Anxiety   . Diverticulitis   . SVT (supraventricular tachycardia)    Past Surgical History  Procedure Laterality Date  . Foot surgery Left    No family history on file. History  Substance Use Topics  . Smoking status: Current Every Day Smoker -- 0.50 packs/day  . Smokeless tobacco: Never Used  . Alcohol Use: Yes     Comment: socially    Review of Systems  Cardiovascular: Positive for palpitations.  All other systems reviewed and are negative.     Allergies  Ativan  Home Medications   Prior to Admission medications   Medication Sig Start Date End Date Taking? Authorizing Provider  diazepam (VALIUM) 10 MG tablet Take 10 mg by mouth every 12 (twelve) hours as needed for anxiety.    Yes Historical Provider, MD   ciprofloxacin (CIPRO) 500 MG tablet Take 1 tablet (500 mg total) by mouth 2 (two) times daily. Patient not taking: Reported on 10/17/2014 03/01/13   Marlon Pel, PA-C  metroNIDAZOLE (FLAGYL) 500 MG tablet Take 1 tablet (500 mg total) by mouth 3 (three) times daily. Patient not taking: Reported on 10/17/2014 03/01/13   Marlon Pel, PA-C  ondansetron (ZOFRAN) 4 MG tablet Take 1 tablet (4 mg total) by mouth every 6 (six) hours. Patient not taking: Reported on 10/17/2014 03/01/13   Marlon Pel, PA-C  oxyCODONE-acetaminophen (PERCOCET/ROXICET) 5-325 MG per tablet Take 1 tablet by mouth every 6 (six) hours as needed for severe pain. Patient not taking: Reported on 10/17/2014 03/01/13   Marlon Pel, PA-C   BP 155/73 mmHg  Pulse 98  Temp(Src) 98.4 F (36.9 C) (Oral)  Resp 17  SpO2 96% Physical Exam  Constitutional: He is oriented to person, place, and time. He appears well-developed and well-nourished.  HENT:  Head: Normocephalic.  Eyes: Conjunctivae and EOM are normal. Pupils are equal, round, and reactive to light.  Cardiovascular: Intact distal pulses.   Minimally tachycardic around 100 bpm Irregular rhythm  Pulmonary/Chest: Effort normal and breath sounds normal. He has no wheezes. He has no rales.  Abdominal: Soft. He exhibits no distension. There is no tenderness.  Musculoskeletal: Normal range of motion. He exhibits no edema or tenderness.  Neurological: He is alert and oriented to person, place, and time. No cranial nerve deficit. He exhibits normal muscle tone. Coordination normal.  Skin: He is not diaphoretic.  Psychiatric: He has a normal mood and affect.    ED Course  Procedures (including critical care time) Labs Review Labs Reviewed  BASIC METABOLIC PANEL - Abnormal; Notable for the following:    Glucose, Bld 157 (*)    All other components within normal limits  CBC  I-STAT TROPOININ, ED    Imaging Review Dg Chest Port 1 View  10/17/2014   CLINICAL DATA:   Irregular heartbeat  EXAM: PORTABLE CHEST - 1 VIEW  COMPARISON:  01/25/2013  FINDINGS: Normal heart size and mediastinal contours. Chronic bronchitic markings. Accounting for low volumes, no acute infiltrate or edema. No effusion or pneumothorax. No acute osseous findings.  IMPRESSION: Stable low volume chest.   Electronically Signed   By: Marnee Spring M.D.   On: 10/17/2014 11:36     EKG Interpretation None      MDM   Final diagnoses:  PVCs (premature ventricular contractions)    Patient with pvcs Hear score of 1.  No SVT. Neg troponin, cxr Appears safe for discharge at this time. I spoke with Dr. Corlis Leak who agrees with plan of care.      Arthor Captain, PA-C 10/17/14 1504  Courteney Randall An, MD 10/18/14 [phone]

## 2014-10-17 NOTE — Discharge Instructions (Signed)
Premature Beats °A premature beat is an extra heartbeat that happens earlier than normal. Premature beats are called premature atrial contractions (PACs) or premature ventricular contractions (PVCs) depending on the area of the heart where they start. °CAUSES  °Premature beats may be brought on by a variety of factors including: °· Emotional stress. °· Lack of sleep. °· Caffeine. °· Asthma medicines. °· Stimulants. °· Herbal teas. °· Dietary supplements. °· Alcohol. °In most cases, premature beats are not dangerous and are not a sign of serious heart disease. Most patients evaluated for premature beats have completely normal heart function. Rarely, premature beats may be a sign of more significant heart problems or medical illness. °SYMPTOMS  °Premature beats may cause palpitations. This means you feel like your heart is skipping a beat or beating harder than usual. Sometimes, slight chest pain occurs with premature beats, lasting only a few seconds. This pain has been described as a "flopping" feeling inside the chest. In many cases, premature beats do not cause any symptoms and they are only detected when an electrocardiography test (EKG) or heart monitoring is performed. °DIAGNOSIS  °Your caregiver may run some tests to evaluate your heart such as an EKG or echocardiography. You may need to wear a portable heart monitor for several days to record the electrical activity of your heart. Blood testing may also be performed to check your electrolytes and thyroid function. °TREATMENT  °Premature beats usually go away with rest. If the problem continues, your caregiver will determine a treatment plan for you.  °HOME CARE INSTRUCTIONS °· Get plenty of rest over the next few days until your symptoms improve. °· Avoid coffee, tea, alcohol, and soda (pop, cola). °· Do not smoke. °SEEK MEDICAL CARE IF: °· Your symptoms continue after 1 to 2 days of rest. °· You have new symptoms, such as chest pain or trouble  breathing. °SEEK IMMEDIATE MEDICAL CARE IF: °· You have severe chest pain or abdominal pain. °· You have pain that radiates into the neck, arm, or jaw. °· You faint or have extreme weakness. °· You have shortness of breath. °· Your heartbeat races for more than 5 seconds. °MAKE SURE YOU: °· Understand these instructions. °· Will watch your condition. °· Will get help right away if you are not doing well or get worse. °Document Released: 04/08/2004 Document Revised: 05/24/2011 Document Reviewed: 11/02/2010 °ExitCare® Patient Information ©2015 [employer]. This information is not intended to replace advice given to you by your health care provider. Make sure you discuss any questions you have with your health care provider. ° °Holter Monitoring °A Holter monitor is a small device with electrodes (small sticky patches) that attach to your chest. It records the electrical activity of your heart and is worn continuously for 24-48 hours.  °A HOLTER MONITOR IS USED TO °· Detect heart problems such as: °¨ Heart arrhythmia. Is an abnormal or irregular heartbeat. With some heart arrhythmias, you may not feel or know that you have an irregular heart rhythm. °¨ Palpitations, such as feeling your heart racing or fluttering. It is possible to have heart palpitations and not have a heart arrhythmia. °¨ A heart rhythm that is too slow or too fast. °· If you have problems fainting, near fainting or feeling light-headed, a Holter monitor may be worn to see if your heart is the cause. °HOLTER MONITOR PREPARATION  °· Electrodes will be attached to the skin on your chest. °· If you have hair on your chest, small areas may have   to be shaved. This is done to help the patches stick better and make the recording more accurate. °· The electrodes are attached by wires to the Holter monitor. The Holter monitor clips to your clothing. You will wear the monitor at all times, even while exercising and sleeping. °HOME CARE INSTRUCTIONS  °· Wear  your monitor at all times. °¨ The wires and the monitor must stay dry. Do not get the monitor wet. °¨ Do not bathe, swim or use a hot tub with it on. °¨ You may do a "sponge" bath while you have the monitor on. °· Keep your skin clean, do not put body lotion or moisturizer on your chest. °· It's possible that your skin under the electrodes could become irritated. To keep this from happening, you may put the electrodes in slightly different places on your chest. °· Your caregiver will also ask you to keep a diary of your activities, such as walking or doing chores. Be sure to note what you are doing if you experience heart symptoms such as palpitations. This will help your caregiver determine what might be contributing to your symptoms. The information stored in your monitor will be reviewed by your caregiver alongside your diary entries. °· Make sure the monitor is safely clipped to your clothing or in a location close to your body that your caregiver recommends. °· The monitor and electrodes are removed when the test is over. Return the monitor as directed. °· Be sure to follow up with your caregiver and discuss your Holter monitor results. °SEEK IMMEDIATE MEDICAL CARE IF: °· You faint or feel lightheaded. °· You have trouble breathing. °· You get pain in your chest, upper arm or jaw. °· You feel sick to your stomach and your skin is pale, cool, or damp. °· You think something is wrong with the way your heart is beating. °MAKE SURE YOU:  °· Understand these instructions. °· Will watch your condition. °· Will get help right away if you are not doing well or get worse. °Document Released: 11/28/2003 Document Revised: 05/24/2011 Document Reviewed: 04/11/2008 °ExitCare® Patient Information ©2015 [employer]. This information is not intended to replace advice given to you by your health care provider. Make sure you discuss any questions you have with your health care provider. ° °

## 2014-10-17 NOTE — ED Notes (Signed)
Per EMS, pt coming in for the feeling of irregular heart beat x45 min. Pt reports that he has experienced this in the past and it has led to SVT. Pt has been seen by cardiologist for the same. Pt was prescribed vallum  for panic attacks  associated with his feeling of the palpations. Pt took one vallum 45 minutes ago. Pt denies CP and has mild SOB. Pt alert x4. NAD at this time. Pt HR currently 102.

## 2014-11-11 NOTE — Progress Notes (Signed)
This encounter was created in error - please disregard.

## 2014-11-12 ENCOUNTER — Encounter: Payer: Managed Care, Other (non HMO) | Admitting: Cardiovascular Disease

## 2014-12-08 NOTE — Progress Notes (Deleted)
Cardiology Office Note   Date:  12/08/2014   ID:  Jim Hatfield, DOB 1970/07/24, MRN [id number]  PCP:  No PCP Per Patient  Cardiologist:   Madilyn Hook, MD   No chief complaint on file.     History of Present Illness: Jim Hatfield is a 44 y.o. male with anxiety, SVT and ongoing tobacco abuse who presents for an evaluation of palpitations.    Jim Hatfield was evaluated in the ED for palpitations on 10/17/14.  At that time EKG showed sinus rhythm with PVCs.  Jim Hatfield has previously been evaluated by a cardiologist and was diagnosed with SVT. Past Medical History  Diagnosis Date  . Anxiety   . Diverticulitis   . SVT (supraventricular tachycardia)     Past Surgical History  Procedure Laterality Date  . Foot surgery Left      Current Outpatient Prescriptions  Medication Sig Dispense Refill  . ciprofloxacin (CIPRO) 500 MG tablet Take 1 tablet (500 mg total) by mouth 2 (two) times daily. (Patient not taking: Reported on 10/17/2014) 20 tablet 0  . diazepam (VALIUM) 10 MG tablet Take 10 mg by mouth every 12 (twelve) hours as needed for anxiety.     . metroNIDAZOLE (FLAGYL) 500 MG tablet Take 1 tablet (500 mg total) by mouth 3 (three) times daily. (Patient not taking: Reported on 10/17/2014) 30 tablet 0  . ondansetron (ZOFRAN) 4 MG tablet Take 1 tablet (4 mg total) by mouth every 6 (six) hours. (Patient not taking: Reported on 10/17/2014) 12 tablet 0  . oxyCODONE-acetaminophen (PERCOCET/ROXICET) 5-325 MG per tablet Take 1 tablet by mouth every 6 (six) hours as needed for severe pain. (Patient not taking: Reported on 10/17/2014) 15 tablet 0   No current facility-administered medications for this visit.    Allergies:   Ativan    Social History:  The patient  reports that he has been smoking.  He has never used smokeless tobacco. He reports that he drinks alcohol. He reports that he does not use illicit drugs.   Family History:  The patient's ***family history is not on file.     ROS:  Please see the history of present illness.   Otherwise, review of systems are positive for {NONE DEFAULTED:18576}.   All other systems are reviewed and negative.    PHYSICAL EXAM: VS:  There were no vitals taken for this visit. , BMI There is no height or weight on file to calculate BMI. GENERAL:  Well appearing HEENT:  Pupils equal round and reactive, fundi not visualized, oral mucosa unremarkable NECK:  No jugular venous distention, waveform within normal limits, carotid upstroke brisk and symmetric, no bruits, no thyromegaly LYMPHATICS:  No cervical adenopathy LUNGS:  Clear to auscultation bilaterally HEART:  RRR.  PMI not displaced or sustained,S1 and S2 within normal limits, no S3, no S4, no clicks, no rubs, *** murmurs ABD:  Flat, positive bowel sounds normal in frequency in pitch, no bruits, no rebound, no guarding, no midline pulsatile mass, no hepatomegaly, no splenomegaly EXT:  2 plus pulses throughout, no edema, no cyanosis no clubbing SKIN:  No rashes no nodules NEURO:  Cranial nerves II through XII grossly intact, motor grossly intact throughout PSYCH:  Cognitively intact, oriented to person place and time    EKG:  EKG {ACTION; IS/IS NWG:[id number]} ordered today. The ekg ordered today demonstrates ***   Recent Labs: 10/17/2014: BUN 15; Creatinine, Ser 1.03; Hemoglobin 15.5; Platelets 181; Potassium 3.8; Sodium 138  Lipid Panel No results found for: CHOL, TRIG, HDL, CHOLHDL, VLDL, LDLCALC, LDLDIRECT    Wt Readings from Last 3 Encounters:  No data found for Wt      Other studies Reviewed: Additional studies/ records that were reviewed today include: ***. Review of the above records demonstrates:  Please see elsewhere in the note.  ***   ASSESSMENT AND PLAN:  ***   Current medicines are reviewed at length with the patient today.  The patient {ACTIONS; HAS/DOES NOT HAVE:19233} concerns regarding medicines.  The following changes have been made:   {PLAN; NO CHANGE:13088:s}  Labs/ tests ordered today include: *** No orders of the defined types were placed in this encounter.     Disposition:   FU with ***    Signed, Madilyn Hook, MD  12/08/2014 6:10 PM    [facility]

## 2014-12-09 ENCOUNTER — Encounter: Payer: Managed Care, Other (non HMO) | Admitting: Cardiovascular Disease

## 2014-12-09 NOTE — Progress Notes (Signed)
This encounter was created in error - please disregard.

## 2015-04-03 ENCOUNTER — Emergency Department (HOSPITAL_COMMUNITY)
Admission: EM | Admit: 2015-04-03 | Discharge: 2015-04-03 | Disposition: A | Discharge status: Home / Self Care | Payer: Managed Care, Other (non HMO) | Attending: Emergency Medicine | Admitting: Emergency Medicine

## 2015-04-03 ENCOUNTER — Emergency Department (HOSPITAL_COMMUNITY): Payer: Managed Care, Other (non HMO)

## 2015-04-03 ENCOUNTER — Encounter (HOSPITAL_COMMUNITY): Payer: Self-pay | Admitting: Family Medicine

## 2015-04-03 DIAGNOSIS — F419 Anxiety disorder, unspecified: Secondary | ICD-10-CM | POA: Insufficient documentation

## 2015-04-03 DIAGNOSIS — F17200 Nicotine dependence, unspecified, uncomplicated: Secondary | ICD-10-CM | POA: Insufficient documentation

## 2015-04-03 DIAGNOSIS — R002 Palpitations: Secondary | ICD-10-CM | POA: Insufficient documentation

## 2015-04-03 DIAGNOSIS — R079 Chest pain, unspecified: Secondary | ICD-10-CM | POA: Insufficient documentation

## 2015-04-03 LAB — BASIC METABOLIC PANEL
ANION GAP: 11 (ref 5–15)
BUN: 11 mg/dL (ref 6–20)
CO2: 23 mmol/L (ref 22–32)
Calcium: 9.6 mg/dL (ref 8.9–10.3)
Chloride: 105 mmol/L (ref 101–111)
Creatinine, Ser: 0.86 mg/dL (ref 0.61–1.24)
GFR calc Af Amer: >60 mL/min (ref >60)
GFR calc non Af Amer: >60 mL/min (ref >60)
GLUCOSE: 101 mg/dL — AB (ref 65–99)
Potassium: 3.9 mmol/L (ref 3.5–5.1)
Sodium: 139 mmol/L (ref 135–145)

## 2015-04-03 LAB — CBC
HCT: 47.6 % (ref 39.0–52.0)
Hemoglobin: 16 g/dL (ref 13.0–17.0)
MCH: 31.1 pg (ref 26.0–34.0)
MCHC: 33.6 g/dL (ref 30.0–36.0)
MCV: 92.6 fL (ref 78.0–100.0)
Platelets: 199 10*3/uL (ref 150–400)
RBC: 5.14 MIL/uL (ref 4.22–5.81)
RDW: 12.7 % (ref 11.5–15.5)
WBC: 8.4 10*3/uL (ref 4.0–10.5)

## 2015-04-03 LAB — I-STAT TROPONIN, ED: Troponin i, poc: 0 ng/mL (ref 0.00–0.08)

## 2015-04-03 NOTE — ED Provider Notes (Signed)
History  By signing my name below, I, Karle Plumber, attest that this documentation has been prepared under the direction and in the presence of Manvir Thorson, PA-C. Electronically Signed: Karle Plumber, ED Scribe. 04/03/2015. 5:55 PM.  Chief Complaint  Patient presents with  . Tachycardia  . Anxiety   The history is provided by the patient and medical records. No language interpreter was used.    HPI Comments:  Jim Hatfield is a 45 y.o. male with PMHx of SVT and anxiety, brought in by EMS, who presents to the Emergency Department complaining of what he thinks was a short SVT episode approximately 5 hours ago. He states the episode lasted approximately two minutes and had resolved by the time he had ended the call with emergency services. He reports mild chest tightness and feeling flushed which is his typical presentation of SVT. He reports taking a Valium for the symptoms after they had already resolved. He denies modifying factors. He denies any symptoms since arriving to the ED. He denies abdominal pain, nausea, vomiting, rhinorrhea, cough, CP, SOB, fever or chills. He states he has SVT episodes about twice yearly and was seen by a cardiologist about 8 years ago and was told it was non life threatening. He states he has an appt to follow up with cardiology next month from the last episode he experienced. He has no other complaints today.   Past Medical History  Diagnosis Date  . Anxiety   . Diverticulitis   . SVT (supraventricular tachycardia) [facility])    Past Surgical History  Procedure Laterality Date  . Foot surgery Left    History reviewed. No pertinent family history. Social History  Substance Use Topics  . Smoking status: Current Every Day Smoker -- 0.50 packs/day  . Smokeless tobacco: Never Used  . Alcohol Use: Yes     Comment: socially    Review of Systems  Respiratory: Positive for chest tightness.   Cardiovascular: Positive for palpitations. Negative for chest  pain.  Psychiatric/Behavioral: The patient is nervous/anxious.   All other systems reviewed and are negative.   Allergies  Ativan  Home Medications   Prior to Admission medications   Medication Sig Start Date End Date Taking? Authorizing Provider  ciprofloxacin (CIPRO) 500 MG tablet Take 1 tablet (500 mg total) by mouth 2 (two) times daily. Patient not taking: Reported on 10/17/2014 03/01/13   Marlon Pel, PA-C  diazepam (VALIUM) 10 MG tablet Take 10 mg by mouth every 12 (twelve) hours as needed for anxiety.     Historical Provider, MD  metroNIDAZOLE (FLAGYL) 500 MG tablet Take 1 tablet (500 mg total) by mouth 3 (three) times daily. Patient not taking: Reported on 10/17/2014 03/01/13   Marlon Pel, PA-C  ondansetron (ZOFRAN) 4 MG tablet Take 1 tablet (4 mg total) by mouth every 6 (six) hours. Patient not taking: Reported on 10/17/2014 03/01/13   Marlon Pel, PA-C  oxyCODONE-acetaminophen (PERCOCET/ROXICET) 5-325 MG per tablet Take 1 tablet by mouth every 6 (six) hours as needed for severe pain. Patient not taking: Reported on 10/17/2014 03/01/13   Marlon Pel, PA-C   Triage Vitals: BP 137/119 mmHg  Pulse 99  Temp(Src) 98.1 F (36.7 C) (Oral)  Resp 18  Ht  (1.854 m)  Wt 225 lb (102.059 kg)  BMI 29.69 kg/m2  SpO2 96% Physical Exam  Constitutional: He appears well-developed and well-nourished. No distress.  Appears anxious  HENT:  Head: Normocephalic and atraumatic.  Right Ear: External ear normal.  Left Ear:  External ear normal.  Eyes: Conjunctivae are normal. Right eye exhibits no discharge. Left eye exhibits no discharge. No scleral icterus.  Neck: Normal range of motion.  Cardiovascular: Normal rate, regular rhythm and normal heart sounds.   HR 70  Pulmonary/Chest: Effort normal and breath sounds normal. No respiratory distress. He has no wheezes. He has no rales.  Musculoskeletal: Normal range of motion.  Moves all extremities spontaneously  Neurological: He is  alert. Coordination normal.  Skin: Skin is warm and dry.  Psychiatric: His behavior is normal. His mood appears anxious.  Nursing note and vitals reviewed.   ED Course  Procedures (including critical care time) DIAGNOSTIC STUDIES: Oxygen Saturation is 96% on RA, adequate by my interpretation.   COORDINATION OF CARE: 5:52 PM- Will recheck vitals, monitor patient and speak with Dr. Patria Mane about appropriate course of treatment. Pt verbalizes understanding and agrees to plan.  Medications - No data to display  Labs Review Labs Reviewed  BASIC METABOLIC PANEL - Abnormal; Notable for the following:    Glucose, Bld 101 (*)    All other components within normal limits  CBC  I-STAT TROPOININ, ED    Imaging Review Dg Chest 2 View  04/03/2015  CLINICAL DATA:  Chest pain.  Smoker. EXAM: CHEST  2 VIEW COMPARISON:  10/17/2014 FINDINGS: The cardiac silhouette is upper limits of normal to mildly enlarged in size. The patient has taken a greater inspiration than on the prior study. No airspace consolidation, edema, pleural effusion, or pneumothorax is identified. No acute osseous abnormality is seen. IMPRESSION: No active cardiopulmonary disease. Electronically Signed   By: Sebastian Ache M.D.   On: 04/03/2015 14:17   I have personally reviewed and evaluated these images and lab results as part of my medical decision-making.    MDM   Final diagnoses:  Palpitations   45 year old male presenting with palpitations. Hx of SVT. Pt reports episode lasted a few minutes and resolved before arriving in ED. No recurrent episodes in 4 hours he has been here. VSS. Pt is anxious appearing. Benign physical exam. HR 70. Blood work unremarkable. Troponin negative with NSR ECG. CXR negative. Pt has a follow up appt scheduled with cardiology next month.  At this time there does not appear to be any evidence of an acute emergency medical condition and the patient appears stable for discharge with appropriate  outpatient follow up. Diagnosis was discussed with patient who verbalizes understanding and is agreeable to discharge. Pt case discussed with Dr. Patria Mane who agrees with my plan. Return precautions given in discharge paperwork and discussed with pt at bedside. Pt is stable for discharge.  I personally performed the services described in this documentation, which was scribed in my presence. The recorded information has been reviewed and is accurate.     Alveta Heimlich, PA-C 04/03/15 1907  Azalia Bilis, MD 04/04/15 [phone]

## 2015-04-03 NOTE — ED Notes (Signed)
Pt stable, ambulatory, states understanding of discharge instructions 

## 2015-04-03 NOTE — Discharge Instructions (Signed)

## 2015-04-03 NOTE — ED Notes (Signed)
Pt here for episode of what he thinks was SVT. sts HR 114 on the monitor by EMS, BP 130/88. Sts feeling anxiety.

## 2016-10-22 ENCOUNTER — Encounter: Payer: Self-pay | Admitting: Family Medicine

## 2016-10-22 ENCOUNTER — Ambulatory Visit (INDEPENDENT_AMBULATORY_CARE_PROVIDER_SITE_OTHER): Payer: BLUE CROSS/BLUE SHIELD | Admitting: Family Medicine

## 2016-10-22 VITALS — BP 138/100 | HR 82 | Ht 72.0 in | Wt 243.4 lb

## 2016-10-22 DIAGNOSIS — Z114 Encounter for screening for human immunodeficiency virus [HIV]: Secondary | ICD-10-CM

## 2016-10-22 DIAGNOSIS — R635 Abnormal weight gain: Secondary | ICD-10-CM

## 2016-10-22 DIAGNOSIS — Z716 Tobacco abuse counseling: Secondary | ICD-10-CM | POA: Diagnosis not present

## 2016-10-22 DIAGNOSIS — R591 Generalized enlarged lymph nodes: Secondary | ICD-10-CM

## 2016-10-22 DIAGNOSIS — R030 Elevated blood-pressure reading, without diagnosis of hypertension: Secondary | ICD-10-CM | POA: Insufficient documentation

## 2016-10-22 DIAGNOSIS — F410 Panic disorder [episodic paroxysmal anxiety] without agoraphobia: Secondary | ICD-10-CM | POA: Insufficient documentation

## 2016-10-22 DIAGNOSIS — F17200 Nicotine dependence, unspecified, uncomplicated: Secondary | ICD-10-CM | POA: Insufficient documentation

## 2016-10-22 DIAGNOSIS — E669 Obesity, unspecified: Secondary | ICD-10-CM | POA: Insufficient documentation

## 2016-10-22 DIAGNOSIS — Z720 Tobacco use: Secondary | ICD-10-CM

## 2016-10-22 LAB — LIPID PANEL
CHOL/HDL RATIO: 6
CHOLESTEROL: 226 mg/dL — AB (ref 0–200)
HDL: 37 mg/dL — AB (ref >39.00)
LDL Cholesterol: 165 mg/dL — ABNORMAL HIGH (ref 0–99)
NonHDL: 189.17
Triglycerides: 119 mg/dL (ref 0.0–149.0)
VLDL: 23.8 mg/dL (ref 0.0–40.0)

## 2016-10-22 LAB — CBC WITH DIFFERENTIAL/PLATELET
Basophils Absolute: 0.1 10*3/uL (ref 0.0–0.1)
Basophils Relative: 1.1 % (ref 0.0–3.0)
EOS ABS: 0.3 10*3/uL (ref 0.0–0.7)
Eosinophils Relative: 3.6 % (ref 0.0–5.0)
HCT: 48.2 % (ref 39.0–52.0)
Hemoglobin: 15.9 g/dL (ref 13.0–17.0)
Lymphocytes Relative: 26.9 % (ref 12.0–46.0)
Lymphs Abs: 2.3 10*3/uL (ref 0.7–4.0)
MCHC: 33 g/dL (ref 30.0–36.0)
MCV: 94 fl (ref 78.0–100.0)
Monocytes Absolute: 0.7 10*3/uL (ref 0.1–1.0)
Monocytes Relative: 8.3 % (ref 3.0–12.0)
NEUTROS ABS: 5.2 10*3/uL (ref 1.4–7.7)
Neutrophils Relative %: 60.1 % (ref 43.0–77.0)
PLATELETS: 219 10*3/uL (ref 150.0–400.0)
RBC: 5.13 Mil/uL (ref 4.22–5.81)
RDW: 13.6 % (ref 11.5–15.5)
WBC: 8.6 10*3/uL (ref 4.0–10.5)

## 2016-10-22 LAB — COMPREHENSIVE METABOLIC PANEL
ALT: 39 U/L (ref 0–53)
AST: 19 U/L (ref 0–37)
Albumin: 4.5 g/dL (ref 3.5–5.2)
Alkaline Phosphatase: 68 U/L (ref 39–117)
BILIRUBIN TOTAL: 0.4 mg/dL (ref 0.2–1.2)
BUN: 11 mg/dL (ref 6–23)
CO2: 26 meq/L (ref 19–32)
CREATININE: 0.95 mg/dL (ref 0.40–1.50)
Calcium: 9.5 mg/dL (ref 8.4–10.5)
Chloride: 104 mEq/L (ref 96–112)
GFR: 89.86 mL/min (ref >60.00)
Glucose, Bld: 100 mg/dL — ABNORMAL HIGH (ref 70–99)
Potassium: 4.3 mEq/L (ref 3.5–5.1)
Sodium: 138 mEq/L (ref 135–145)
Total Protein: 7 g/dL (ref 6.0–8.3)

## 2016-10-22 LAB — TSH: TSH: 0.84 u[IU]/mL (ref 0.35–4.50)

## 2016-10-22 NOTE — Patient Instructions (Addendum)
I think the lump in your arm is most likely benign. Give it another few weeks. If it worsens or does not go away, call us and we can schedule an ultrasound to have it evaluated.  Your goal for exercising is 150minutes per week.  We will check blood work today.   Come back to see me in 1 year for your next physical, or sooner as needed.  Take care,  Dr Jimmey RalphParker

## 2016-10-22 NOTE — Assessment & Plan Note (Signed)
New problem. Stable on propranol and valium as needed. No changes today. Consider switch to SSRI/buspar if symptoms worsen in the future.

## 2016-10-22 NOTE — Assessment & Plan Note (Signed)
Counseled for 5 minutes today. Not ready to quit today. Discussed resources. Patient wants to try to starting with weight loss before quitting.

## 2016-10-22 NOTE — Progress Notes (Signed)
Subjective:  Jim Hatfield is a 46 y.o. male who presents today with a chief complaint of right axilla mass and to establish care.   HPI:  Right Axilla Mass First noticed a little over a week ago. Lump is painless and thinks that it has stayed about the same in size over the past week. No fevers or chills. No night sweats. Nothing similar in the past. No weight loss. No history of trauma or infection.  Foot Lesions Patient also noticed "blisters" on the dorsal aspect of his right foot a couple of weeks ago. The lesions were not painful or pruritic. They have since healed and he has not had any other issue. No known exposures though he does wear sandals and flip flops everyday.   Weight Gain Patient also with difficulty losing weight over the past year or so after starting propranolol. He has gained about 20 pounds overall. Says that he was gaining about two pounds a day on a 1500 calorie per day diet. Is exercising about 20 minutes three times per week.  Anxiety / Panic Disorder Patient diagnosed several years ago with initially SVT. He was seen by and evaluated by Dr Jacinto Halim and the patient reports that his symptoms were mostly attributed to panic disorder. He is currently on propranolol daily for this and also takes valium as needed. He uses valium less than one time per month. Patient currently reports only having anxiety surrounding his heart.  Depression screen PHQ 2/9 10/22/2016  Decreased Interest 1  Down, Depressed, Hopeless 1  PHQ - 2 Score 2  Altered sleeping 0  Tired, decreased energy 1  Change in appetite 2  Feeling bad or failure about yourself  1  Trouble concentrating 0  Moving slowly or fidgety/restless 0  Suicidal thoughts 0  PHQ-9 Score 6  Difficult doing work/chores Somewhat difficult   GAD 7 : Generalized Anxiety Score 10/22/2016  Nervous, Anxious, on Edge 3  Control/stop worrying 3  Worry too much - different things 3  Trouble relaxing 3  Restless 0    Easily annoyed or irritable 0  Afraid - awful might happen 3  Total GAD 7 Score 15  Anxiety Difficulty Somewhat difficult   Tobacco Abuse Current half a pack per day smoker. Had quit for about 3 years but recently started back. Is ready to quit, though wants to start losing weight first.   ROS: Per HPI, otherwise all systems reviewed and are negative  PMH:  The following were reviewed and entered/updated in epic: Past Medical History:  Diagnosis Date  . Anxiety   . Cardiac arrhythmia due to congenital heart disease   . Depression   . Diverticulitis   . Diverticulitis   . SVT (supraventricular tachycardia) [facility])    Patient Active Problem List   Diagnosis Date Noted  . Panic disorder 10/22/2016  . Tobacco abuse 10/22/2016  . Elevated blood pressure reading 10/22/2016  . Obesity (BMI 30-39.9) 10/22/2016   Past Surgical History:  Procedure Laterality Date  . FOOT SURGERY Left     Family History  Problem Relation Age of Onset  . Cancer Mother        Breast  . Hypertension Mother   . Cancer Father 27       Pancreatic   . Heart attack Father   . Hyperlipidemia Father   . Hypertension Father   . Heart disease Father   . Depression Sister   . Depression Brother     Medications- reviewed  and updated Current Outpatient Prescriptions  Medication Sig Dispense Refill  . diazepam (VALIUM) 10 MG tablet Take 10 mg by mouth every 12 (twelve) hours as needed for anxiety.     Marland Kitchen. PROPRANOLOL HCL ER PO Take 120 mg by mouth. Takes once per day.      No current facility-administered medications for this visit.     Allergies-reviewed and updated Allergies  Allergen Reactions  . Ativan [Lorazepam] Other (See Comments)    Jittery    Social History   Social History  . Marital status: Married    Spouse name: N/A  . Number of children: N/A  . Years of education: N/A   Social History Main Topics  . Smoking status: Current Every Day Smoker    Packs/day: 0.50  . Smokeless  tobacco: Never Used  . Alcohol use Yes     Comment: socially  . Drug use: No  . Sexual activity: Yes   Other Topics Concern  . None   Social History Narrative  . None    Objective:  Physical Exam: BP (!) 138/100 (BP Location: Left Arm, Cuff Size: Large)   Pulse 82   Ht 6' (1.829 m)   Wt 243 lb 6.4 oz (110.4 kg)   SpO2 98%   BMI 33.01 kg/m   Gen: NAD, resting comfortably CV: RRR with no murmurs appreciated Pulm: NWOB, CTAB with no crackles, wheezes, or rhonchi GI: Obese, normal bowel sounds present. Soft, Nontender, Nondistended. MSK: - R Axilla: 1.5cm mass on anterior edge of right axilla. Freely mobile. Nontender. No erythema. No fluctuance. No evidence of trauma or infection in RUE.  Skin: warm, dry Neuro: grossly normal, moves all extremities Psych: Normal affect and thought content  Assessment/Plan:  Panic disorder New problem. Stable on propranol and valium as needed. No changes today. Consider switch to SSRI/buspar if symptoms worsen in the future.   Tobacco abuse Counseled for 5 minutes today. Not ready to quit today. Discussed resources. Patient wants to try to starting with weight loss before quitting.   Elevated blood pressure reading Slightly above goal today. Likely related to anxiety. Per patient, BP usually in the 120/80 range. Asked patient to check BP at home over the next few weeks and let us know if persistently elevated above 140/90.   Obesity (BMI 30-39.9) Discussed weight loss and exercise regimen. Will check TSH, CBC, CMET, and lipid panel.   Lymphadenopathy Likely benign reactive lymphnode given that it has only been present for about a week and the patient does not have any red flag signs or symptoms. Discussed watchful waiting with patient. Will watch for another 2-3 weeks, if persists will obtain ultrasound +/- FNA pending results of that. Strict return precautions reviewed.   Katina Degreealeb M. Jimmey RalphParker, MD 10/22/2016 11:17 AM

## 2016-10-22 NOTE — Assessment & Plan Note (Signed)
Discussed weight loss and exercise regimen. Will check TSH, CBC, CMET, and lipid panel.

## 2016-10-22 NOTE — Assessment & Plan Note (Signed)
Slightly above goal today. Likely related to anxiety. Per patient, BP usually in the 120/80 range. Asked patient to check BP at home over the next few weeks and let us know if persistently elevated above 140/90.

## 2016-10-23 LAB — HIV ANTIBODY (ROUTINE TESTING W REFLEX): HIV: NONREACTIVE

## 2016-10-25 ENCOUNTER — Other Ambulatory Visit: Payer: Self-pay

## 2016-10-25 DIAGNOSIS — E1169 Type 2 diabetes mellitus with other specified complication: Secondary | ICD-10-CM | POA: Insufficient documentation

## 2016-10-25 DIAGNOSIS — E7800 Pure hypercholesterolemia, unspecified: Secondary | ICD-10-CM

## 2016-10-25 MED ORDER — ASPIRIN EC 81 MG PO TBEC: 81.0000 mg | DELAYED_RELEASE_TABLET | Freq: Every day | ORAL | 0 refills | Status: DC

## 2017-03-03 ENCOUNTER — Telehealth: Payer: Self-pay | Admitting: Family Medicine

## 2017-03-03 NOTE — Telephone Encounter (Signed)
Please be advised on the medication refill note below.  Copied from CRM [phone]. Topic: General - Other >> Mar 03, 2017 12:44 PM Lelon FrohlichGolden, Tashia, RMA wrote: Reason for CRM: Pt stated that his cardiologists wanted his pro[facility] er 120mg  [facility] [address] pt stated that he has 3 days worth of medication left

## 2017-03-04 ENCOUNTER — Other Ambulatory Visit: Payer: Self-pay

## 2017-03-04 MED ORDER — PROPRANOLOL HCL ER 120 MG PO CP24: 120.0000 mg | ORAL_CAPSULE | Freq: Every day | ORAL | 5 refills | Status: DC

## 2017-03-04 NOTE — Telephone Encounter (Signed)
Pt is calling saying that he called the pharmacy this morning & they do not have the script. He will be out of the pills in two days, he states he can not miss a pill. Please advise.

## 2017-03-04 NOTE — Telephone Encounter (Signed)
Refill sent to pharmacy.   

## 2017-03-29 ENCOUNTER — Ambulatory Visit: Payer: BLUE CROSS/BLUE SHIELD | Admitting: Family Medicine

## 2017-03-29 ENCOUNTER — Encounter: Payer: Self-pay | Admitting: Family Medicine

## 2017-03-29 ENCOUNTER — Ambulatory Visit (INDEPENDENT_AMBULATORY_CARE_PROVIDER_SITE_OTHER): Payer: BLUE CROSS/BLUE SHIELD

## 2017-03-29 VITALS — BP 144/84 | HR 93 | Temp 98.8°F | Ht 72.0 in | Wt 253.6 lb

## 2017-03-29 DIAGNOSIS — E669 Obesity, unspecified: Secondary | ICD-10-CM

## 2017-03-29 DIAGNOSIS — R030 Elevated blood-pressure reading, without diagnosis of hypertension: Secondary | ICD-10-CM

## 2017-03-29 DIAGNOSIS — L0291 Cutaneous abscess, unspecified: Secondary | ICD-10-CM

## 2017-03-29 DIAGNOSIS — G8929 Other chronic pain: Secondary | ICD-10-CM

## 2017-03-29 DIAGNOSIS — M545 Low back pain: Secondary | ICD-10-CM | POA: Diagnosis not present

## 2017-03-29 DIAGNOSIS — F17200 Nicotine dependence, unspecified, uncomplicated: Secondary | ICD-10-CM

## 2017-03-29 MED ORDER — DOXYCYCLINE HYCLATE 100 MG PO TABS: 100.0000 mg | ORAL_TABLET | Freq: Two times a day (BID) | ORAL | 0 refills | Status: DC

## 2017-03-29 MED ORDER — INSULIN PEN NEEDLE 31G X 5 MM MISC: 0 refills | Status: DC

## 2017-03-29 MED ORDER — LIRAGLUTIDE -WEIGHT MANAGEMENT 18 MG/3ML ~~LOC~~ SOPN: 3.0000 mg | PEN_INJECTOR | Freq: Every day | SUBCUTANEOUS | 11 refills | Status: DC

## 2017-03-29 MED ORDER — MELOXICAM 15 MG PO TABS: 15.0000 mg | ORAL_TABLET | Freq: Every day | ORAL | 0 refills | Status: DC

## 2017-03-29 MED ORDER — LIRAGLUTIDE -WEIGHT MANAGEMENT 18 MG/3ML ~~LOC~~ SOPN: 1.0000 "pen " | PEN_INJECTOR | SUBCUTANEOUS | 0 refills | Status: DC

## 2017-03-29 MED ORDER — PROPRANOLOL HCL ER 120 MG PO CP24: 120.0000 mg | ORAL_CAPSULE | Freq: Every day | ORAL | 3 refills | Status: DC

## 2017-03-29 NOTE — Patient Instructions (Addendum)
Please start the doxycycline for your abscess.  If it is not getting better, or if it worsens please let us know.  We will start Saxenda to help you lose weight.  Please increase your dosage according to the chart.  We will need to follow-up in 1 month to make sure it is working well for you.  I would like to put you on 2 weeks of prescription strength anti-inflammatory for your low back pain.  I would also like for you to see physical therapy.  Come back to see me in 1 month.  Take care, Dr Jimmey RalphParker

## 2017-03-29 NOTE — Assessment & Plan Note (Signed)
No red flag signs or symptoms.  His x-ray has some mild scoliosis-we will await radiology read.  In the interim, we will start Mobic once daily for the next 2 weeks.  He also has some Flexeril at home-advised him to use this as needed.  Also send in a referral to physical therapy.  If no improvement with conservative management, he will need orthopedics referral.

## 2017-03-29 NOTE — Assessment & Plan Note (Signed)
Again elevated today.  Patient is usually in the 130s over 80s at home.  Elevated today likely secondary to pain and tobacco abuse.  Defer starting pharmacological therapy today.  He will follow-up in 1 month.  If still elevated, will likely need to start pharmacological therapy.

## 2017-03-29 NOTE — Assessment & Plan Note (Signed)
Patient has tried several lifestyle modifications including caloric restriction to about 1800 cal/day.  He is unable to exercise a lot due to his chronic back pain.  I think he would benefit from starting pharmacological therapy today.  We discussed his treatment options.  We will start Saxenda.  He will follow-up in 1 month.  Consider nutrition referral.

## 2017-03-29 NOTE — Assessment & Plan Note (Signed)
Patient was asked about his tobacco use today and was strongly advised to quit. Patient is currently contemplative. We reviewed treatment options to assist him quit smoking including NRT, Chantix, and Bupropion.  He is not yet ready to quit today as he is afraid it will make him gain weight.  He would like to stop cold [place] without use of an RT, Chantix, or bupropion.  Total time spent counseling approximately 5 minutes.

## 2017-03-29 NOTE — Progress Notes (Signed)
Subjective:  Jim Hatfield is a 47 y.o. male who presents today with a chief complaint of arm lesion and back pain.   HPI:  Arm Lesion, Acute Issue Started about two weeks ago. Initially got worse, then better, then worse again. Located on left upper extremity. Occasional fevers at night. No chills. Tried hot compresses which has helped some.  No other home treatments tried.  No other obvious alleviating or aggravating factors.  Back Pain, New Problem Several year history.  Worsened over the last several weeks to months.  Thinks it is worsened due to his weight gain.  Pain is worse with movement and with standing.  Pain mostly located in lower mid back.  Occasionally has pain that radiates down into his right leg.  No history of trauma.  No obvious precipitating events.  No bowel or bladder incontinence.  No urinary retention.  Has previously been diagnosed with scoliosis.  Obesity, established problem, worsening Patient has been trying to lose weight.  States that he went about 2 weeks on an 1800 -calorie/day diet however was not able to lose any weight.  He does not exercise very much secondary to his back pain.  Wt Readings from Last 3 Encounters:  03/29/17 253 lb 9.6 oz (115 kg)  10/22/16 243 lb 6.4 oz (110.4 kg)  04/03/15 225 lb (102.1 kg)   Elevated Blood Pressure reading, established problem, stable Typically in the 130s over 80s at home.  He takes propranolol for anxiety/panic disorder.  Nicotine dependence, established problem, stable Patient still smokes.  He wants to stop smoking however is worried that he will gain a lot of weight if he stops.  He has quit several times in the past mostly via cold [place].  He has tried Chantix and Wellbutrin in the past which not work for him.  ROS: Per HPI  PMH: He reports that he has been smoking.  He has been smoking about 0.50 packs per day. he has never used smokeless tobacco. He reports that he drinks alcohol. He reports that he  does not use drugs.   Objective:  Physical Exam: BP (!) 144/84 (BP Location: Right Arm, Patient Position: Sitting, Cuff Size: Normal)   Pulse 93   Temp 98.8 F (37.1 C) (Oral)   Ht 6' (1.829 m)   Wt 253 lb 9.6 oz (115 kg)   SpO2 96%   BMI 34.39 kg/m   Gen: NAD, resting comfortably CV: RRR with no murmurs appreciated Pulm: NWOB, CTAB with no crackles, wheezes, or rhonchi GI: Normal bowel sounds present. Soft, Nontender, Nondistended. MSK:  -Left upper extremity: Approximately 2 cm erythematous, indurated area on anterior aspect just superior to antecubital fossa.  Mildly tender to palpation.  No surrounding erythema.  No fluctuance. -Back: No deformities.  Nontender to palpation.  Nontender to percussion along spinous processes. -Lower extremities: No deformities.  Strength 5 out of 5 throughout.  Patellar reflexes 2+ and symmetric bilaterally.  Sensation light touch intact throughout.  Straight leg raise negative. Skin: Warm, dry Neuro: Grossly normal, moves all extremities Psych: Normal affect and thought content  Assessment/Plan:  Obesity (BMI 30-39.9) Patient has tried several lifestyle modifications including caloric restriction to about 1800 cal/day.  He is unable to exercise a lot due to his chronic back pain.  I think he would benefit from starting pharmacological therapy today.  We discussed his treatment options.  We will start Saxenda.  He will follow-up in 1 month.  Consider nutrition referral.  Nicotine  dependence with current use Patient was asked about his tobacco use today and was strongly advised to quit. Patient is currently contemplative. We reviewed treatment options to assist him quit smoking including NRT, Chantix, and Bupropion.  He is not yet ready to quit today as he is afraid it will make him gain weight.  He would like to stop cold [place] without use of an RT, Chantix, or bupropion.  Total time spent counseling approximately 5 minutes.    Elevated blood  pressure reading Again elevated today.  Patient is usually in the 130s over 80s at home.  Elevated today likely secondary to pain and tobacco abuse.  Defer starting pharmacological therapy today.  He will follow-up in 1 month.  If still elevated, will likely need to start pharmacological therapy.  Chronic low back pain No red flag signs or symptoms.  His x-ray has some mild scoliosis-we will await radiology read.  In the interim, we will start Mobic once daily for the next 2 weeks.  He also has some Flexeril at home-advised him to use this as needed.  Also send in a referral to physical therapy.  If no improvement with conservative management, he will need orthopedics referral.  Cutaneous abscess No clear areas of fluctuance today-we deferred I&D.  We will start oral antibiotics.  Continue warm compresses.  Discussed strict return precautions.  May need I&D in the near future if not improving on oral antibiotics.   Katina Degreealeb M. Jimmey RalphParker, MD 03/29/2017 12:11 PM

## 2017-03-30 NOTE — Progress Notes (Signed)
Called patient directly to discuss xray findings - nothing acute. Some findings of degenerative disease. Patient voiced understanding and had no further questions.

## 2017-04-04 ENCOUNTER — Ambulatory Visit: Payer: BLUE CROSS/BLUE SHIELD | Admitting: Physical Therapy

## 2017-04-04 ENCOUNTER — Encounter: Payer: Self-pay | Admitting: Physical Therapy

## 2017-04-04 DIAGNOSIS — M5442 Lumbago with sciatica, left side: Secondary | ICD-10-CM

## 2017-04-04 DIAGNOSIS — G8929 Other chronic pain: Secondary | ICD-10-CM | POA: Diagnosis not present

## 2017-04-04 DIAGNOSIS — M546 Pain in thoracic spine: Secondary | ICD-10-CM

## 2017-04-04 NOTE — Therapy (Addendum)
[facility] [address] , [place], [address] Phone: [phone]   Fax:  [phone]  Physical Therapy Evaluation  Patient Details  Name: Jim FiscalShannon B Brogden MRN: [id number] Date of Birth: 04/25/1970 Referring Provider: Jimmey RalphParker   Encounter Date: 04/04/2017  PT End of Session - 04/04/17 1010    Visit Number  1    Number of Visits  12    Date for PT Re-Evaluation  05/16/17    Authorization Type  BCBS    PT Start Time  [phone]    PT Stop Time  0928    PT Time Calculation (min)  46 min    Activity Tolerance  Patient tolerated treatment well       Past Medical History:  Diagnosis Date  . Anxiety   . Cardiac arrhythmia due to congenital heart disease   . Depression   . Diverticulitis   . Diverticulitis   . SVT (supraventricular tachycardia) (HCC)     Past Surgical History:  Procedure Laterality Date  . FOOT SURGERY Left     There were no vitals filed for this visit.   Subjective Assessment - 04/04/17 1006    Subjective  Pt states worsening pain in back, with trying to increase activity level. He states that he has gained weight from previous(questionable) heart issue (SVT). He had decreased activity for about 1 year, during testing. He now is trying to increase standing/walking, and having significant pain in low thoracic/upper lumbar region. He works from home, sits at computer, likes to walk dogs but finds this difficut. He saw spine specialist several years ago, was told he had scoliosis, but no evidence of this or other findings on current lumbar x-ray.     Limitations  Standing;Walking;House hold activities    Diagnostic tests  recent x-ray     Patient Stated Goals  decrease pain, increase activity level and start working out to loose weight     Currently in Pain?  Yes    Pain Score  7     Pain Location  Back    Pain Orientation  Mid    Pain Descriptors / Indicators  Sharp;Throbbing    Pain Onset  More than a month ago    Pain Frequency  Intermittent    Aggravating Factors   Standing, walking    Pain Relieving Factors  sitting         [facility] PT Assessment - 04/04/17 0001      Assessment   Medical Diagnosis  Low Back Pain    Referring Provider  Jimmey RalphParker    Prior Therapy  no      Precautions   Precautions  None      Balance Screen   Has the patient fallen in the past 6 months  No    Has the patient had a decrease in activity level because of a fear of falling?   No      Home Public house managernvironment   Living Environment  Private residence      Prior Function   Level of Independence  Independent      Cognition   Overall Cognitive Status  Within Functional Limits for tasks assessed      ROM / Strength   AROM / PROM / Strength  AROM;Strength      AROM   Overall AROM Comments  Hip ROM; mild limitation for rotation, moderate for extension    AROM Assessment Site  Lumbar    Lumbar Flexion  [facility]  Lumbar Extension  moderate deficit and pain    Lumbar - Right Side Bend  WFL    Lumbar - Left Side Bend  mild deficit and pain      Strength   Overall Strength Comments  Core: 2/5;  Hips:4-/5;        Palpation   Palpation comment  Most painful site at T10-12 on L, painful spring, tightness of B thoracic and lumbar paraspinals and QL      Special Tests   Other special tests  Negative SLR, unable to reproduce tingling,. Significant tightness in hip flexors.  Posture:(standing) increased anterior pelvic tilt, increased thoracic and lumbar lordosis , fwd flexion of hips             Objective measurements completed on examination: See above findings.      [facility] Adult PT Treatment/Exercise - 04/04/17 0001      Exercises   Exercises  Lumbar;Knee/Hip      Lumbar Exercises: Stretches   Single Knee to Chest Stretch  3 reps;30 seconds    Double Knee to Chest Stretch  2 reps;30 seconds    Other Lumbar Stretch Exercise  Childs pose 30sec x3       Lumbar Exercises: Supine   Ab Set  10 reps;5 seconds     Pelvic Tilt  15 reps;5 seconds      Knee/Hip Exercises: Stretches   Hip Flexor Stretch  3 reps;20 seconds;Both    Hip Flexor Stretch Limitations  Thomas test position             PT Education - 04/04/17 1010    Education provided  Yes    Education Details  HEP with handout, education on posture, and POC    Person(s) Educated  Patient    Methods  Explanation;Demonstration;Verbal cues;Handout    Comprehension  Verbalized understanding       PT Short Term Goals - 04/04/17 1015      PT SHORT TERM GOAL #1   Title  Pt to demo independence with initial HEP    Time  2    Period  Weeks    Status  New    Target Date  04/18/17      PT SHORT TERM GOAL #2   Title  Pt to demo decreased pain with standing for 15  min, to 4/10    Time  3    Period  Weeks    Status  New    Target Date  04/25/17        PT Long Term Goals - 04/04/17 1015      PT LONG TERM GOAL #1   Title  Pt to report ability to stand/walk for up to 1 hour, with pain 0-2/10 , to improve ability for IADLS, and community navigation    Time  6    Period  Weeks    Status  New    Target Date  05/16/17      PT LONG TERM GOAL #2   Title  Pt to demo improved lumbar ROM to be WNL, and pain free, to improve ability for ADLs and IADLs.     Time  6    Period  Weeks    Status  New    Target Date  05/16/17      PT LONG TERM GOAL #3   Title  Pt to demo independence with final HEP for stretching and core strengthening, and walking program, for safe return to exercise  Time  6    Period  Weeks    Status  New    Target Date  05/16/17             Plan - 04/04/17 1013    Clinical Impression Statement      Clinical Presentation  Stable    Clinical Decision Making  Low    Rehab Potential  Good    PT Frequency  2x / week    PT Duration  6 weeks    PT Treatment/Interventions  ADLs/Self Care Home Management;Cryotherapy;Electrical Stimulation;Ultrasound;Moist Heat;Gait training;Stair training;Functional mobility  training;Therapeutic activities;Therapeutic exercise;Orthotic Fit/Training;Patient/family education;Neuromuscular re-education;Manual techniques;Taping;Dry needling;Passive range of motion    PT Next Visit Plan  Review and progress HEP, Manual     PT Home Exercise Plan  Given handout       Assessment:  Pt presents with primary complaint of increased pain in low back and thoracic region that has been longstanding. He has significant soreness to palpate lower thoracic vertebrae, with increased muscle tightness at L paraspinals and B QL. He has significant tightness of hip flexors which is contributing to his lumbar and standing posture, and likely his pain. He has very poor core muscle strength, also with visible diastasis of rectus abdominus. He has lack of effective HEP, and will benefit from education on return to exercise for weight loss. Pt with pain and deficits that are effecting his ability for daily activities, standing and walking. Pt to benefit from skilled PT to improve this.   Patient will benefit from skilled therapeutic intervention in order to improve the following deficits and impairments:  Abnormal gait, Pain, Increased muscle spasms, Decreased mobility, Decreased activity tolerance, Decreased endurance, Decreased range of motion, Decreased strength, Hypomobility, Impaired flexibility, Difficulty walking  Visit Diagnosis:   Chronic bilateral low back pain with left-sided sciatica - Plan: PT plan of care cert/re-cert  Pain in thoracic spine - Plan: PT plan of care cert/re-cert     Problem List Patient Active Problem List   Diagnosis Date Noted  . Chronic low back pain 03/29/2017  . Hypercholesteremia 10/25/2016  . Panic disorder 10/22/2016  . Nicotine dependence with current use 10/22/2016  . Elevated blood pressure reading 10/22/2016  . Obesity (BMI 30-39.9) 10/22/2016    Sedalia Muta, PT, DPT 10:46 AM  04/04/17    [facility]  [address] [place], [place], [address] Phone: [phone]   Fax:  [phone]  Name: HEARL HEIKES MRN: [id number] Date of Birth: [birth year]

## 2017-04-05 ENCOUNTER — Telehealth: Payer: Self-pay

## 2017-04-05 NOTE — Telephone Encounter (Signed)
Saxenda PA approved through 08/12/2017.

## 2017-04-07 ENCOUNTER — Ambulatory Visit: Payer: BLUE CROSS/BLUE SHIELD | Admitting: Physical Therapy

## 2017-04-07 ENCOUNTER — Encounter: Payer: Self-pay | Admitting: Physical Therapy

## 2017-04-07 DIAGNOSIS — M546 Pain in thoracic spine: Secondary | ICD-10-CM | POA: Diagnosis not present

## 2017-04-07 DIAGNOSIS — G8929 Other chronic pain: Secondary | ICD-10-CM

## 2017-04-07 DIAGNOSIS — M545 Low back pain: Secondary | ICD-10-CM | POA: Diagnosis not present

## 2017-04-07 NOTE — Therapy (Signed)
[facility] [address] [place], [place], [address] Phone: [phone]   Fax:  [phone]  Physical Therapy Treatment  Patient Details  Name: Jim Hatfield MRN: [id number] Date of Birth: 01/05/1971 Referring Provider: Jimmey RalphParker   Encounter Date: 04/07/2017  PT End of Session - 04/07/17 1534    Visit Number  2    Number of Visits  12    Date for PT Re-Evaluation  05/16/17    Authorization Type  BCBS    PT Start Time  1217    PT Stop Time  1302    PT Time Calculation (min)  45 min    Activity Tolerance  Patient tolerated treatment well       Past Medical History:  Diagnosis Date  . Anxiety   . Cardiac arrhythmia due to congenital heart disease   . Depression   . Diverticulitis   . Diverticulitis   . SVT (supraventricular tachycardia) (HCC)     Past Surgical History:  Procedure Laterality Date  . FOOT SURGERY Left     There were no vitals filed for this visit.  Subjective Assessment - 04/07/17 1533    Subjective  Pt states his back feels about the same .    Currently in Pain?  Yes    Pain Score  5     Pain Location  Back    Pain Orientation  Mid;Left    Pain Descriptors / Indicators  Sharp;Throbbing    Pain Type  Chronic pain    Pain Onset  More than a month ago    Pain Frequency  Intermittent                      [facility] Adult PT Treatment/Exercise - 04/07/17 1219      Exercises   Exercises  Lumbar;Knee/Hip      Lumbar Exercises: Stretches   Single Knee to Chest Stretch  --    Double Knee to Chest Stretch  2 reps;30 seconds    Lower Trunk Rotation  -- x20      Lumbar Exercises: Aerobic   Stationary Bike  L1 x6 min      Lumbar Exercises: Standing   Other Standing Lumbar Exercises  Extension x20; press ups at wall x20      Lumbar Exercises: Supine   Ab Set  5 seconds;5 reps    Pelvic Tilt  15 reps;5 seconds    Bent Knee Raise  20 reps    Bent Knee Raise Limitations  with TA      Knee/Hip  Exercises: Stretches   Hip Flexor Stretch  3 reps;20 seconds;Both    Hip Flexor Stretch Limitations  Thomas test position      Manual Therapy   Manual Therapy  Joint mobilization;Soft tissue mobilization;Passive ROM    Joint Mobilization  Thoracic PA mobs; Anterior hip mobs grade 3     Soft tissue mobilization  STM to L Thoracic Paraspinals    Passive ROM  to increase hip extension             PT Education - 04/07/17 1533    Education provided  Yes    Education Details  HEP    Person(s) Educated  Patient    Methods  Explanation    Comprehension  Verbalized understanding       PT Short Term Goals - 04/04/17 1015      PT SHORT TERM GOAL #1   Title  Pt to  demo independence with initial HEP    Time  2    Period  Weeks    Status  New    Target Date  04/18/17      PT SHORT TERM GOAL #2   Title  Pt to demo decreased pain with standing for 15  min, to 4/10    Time  3    Period  Weeks    Status  New    Target Date  04/25/17        PT Long Term Goals - 04/04/17 1015      PT LONG TERM GOAL #1   Title  Pt to report ability to stand/walk for up to 1 hour, with pain 0-2/10 , to improve ability for IADLS, and community navigation    Time  6    Period  Weeks    Status  New    Target Date  05/16/17      PT LONG TERM GOAL #2   Title  Pt to demo improved lumbar ROM to be WNL, and pain free, to improve ability for ADLs and IADLs.     Time  6    Period  Weeks    Status  New    Target Date  05/16/17      PT LONG TERM GOAL #3   Title  Pt to demo independence with final HEP for stretching and core strengthening, and walking program, for safe return to exercise     Time  6    Period  Weeks    Status  New    Target Date  05/16/17            Plan - 04/07/17 1536    Clinical Impression Statement  Pt with improved gait pattern after practice and education today, for longer stride length, and to swing arms. Pt with tendency for still upper body posture, and decreased  arm swing, as well as anterior pelvic tilt. Pt with soreness at L mid thoracic region with manual today. Plan to continue manual, pain relief, and progress exercises as tolerated.    Rehab Potential  Good    PT Frequency  2x / week    PT Duration  6 weeks    PT Treatment/Interventions  ADLs/Self Care Home Management;Cryotherapy;Electrical Stimulation;Ultrasound;Moist Heat;Gait training;Stair training;Functional mobility training;Therapeutic activities;Therapeutic exercise;Orthotic Fit/Training;Patient/family education;Neuromuscular re-education;Manual techniques;Taping;Dry needling;Passive range of motion    PT Next Visit Plan  Review and progress HEP, Manual     PT Home Exercise Plan  Given handout       Patient will benefit from skilled therapeutic intervention in order to improve the following deficits and impairments:  Abnormal gait, Pain, Increased muscle spasms, Decreased mobility, Decreased activity tolerance, Decreased endurance, Decreased range of motion, Decreased strength, Hypomobility, Impaired flexibility, Difficulty walking  Visit Diagnosis: Pain in thoracic spine  Chronic low back pain, unspecified back pain laterality, with sciatica presence unspecified     Problem List Patient Active Problem List   Diagnosis Date Noted  . Chronic low back pain 03/29/2017  . Hypercholesteremia 10/25/2016  . Panic disorder 10/22/2016  . Nicotine dependence with current use 10/22/2016  . Elevated blood pressure reading 10/22/2016  . Obesity (BMI 30-39.9) 10/22/2016   Sedalia Muta, PT, DPT 3:39 PM  04/07/17    [facility] [address] [place], [place], [address] Phone: [phone]   Fax:  [phone]  Name: Jim Hatfield MRN: [id number] Date of Birth: 1970/07/08

## 2017-04-14 ENCOUNTER — Ambulatory Visit: Payer: BLUE CROSS/BLUE SHIELD | Admitting: Physical Therapy

## 2017-04-14 ENCOUNTER — Encounter: Payer: Self-pay | Admitting: Physical Therapy

## 2017-04-14 DIAGNOSIS — M546 Pain in thoracic spine: Secondary | ICD-10-CM

## 2017-04-14 DIAGNOSIS — M545 Low back pain: Secondary | ICD-10-CM | POA: Diagnosis not present

## 2017-04-14 DIAGNOSIS — G8929 Other chronic pain: Secondary | ICD-10-CM

## 2017-04-14 NOTE — Therapy (Signed)
[facility] [address] [place], [place], [address] Phone: [phone]   Fax:  [phone]  Physical Therapy Treatment  Patient Details  Name: Jim Hatfield MRN: [id number] Date of Birth: [birth year] Referring Provider: [name]   Encounter Date: 04/14/2017  PT End of Session - 04/14/17 0854    Visit Number  3    Number of Visits  12    Date for PT Re-Evaluation  05/16/17    Authorization Type  BCBS    PT Start Time  0848    PT Stop Time  0933    PT Time Calculation (min)  45 min    Activity Tolerance  Patient tolerated treatment well       Past Medical History:  Diagnosis Date  . Anxiety   . Cardiac arrhythmia due to congenital heart disease   . Depression   . Diverticulitis   . Diverticulitis   . SVT (supraventricular tachycardia) (HCC)     Past Surgical History:  Procedure Laterality Date  . FOOT SURGERY Left     There were no vitals filed for this visit.  Subjective Assessment - 04/14/17 0853    Subjective  Pt states much improvement in back pain. He has been doing HEP, and concentrating on his walking mechanics.     Currently in Pain?  Yes    Pain Score  2     Pain Location  Back    Pain Orientation  Mid;Left    Pain Descriptors / Indicators  Sharp;Sore    Pain Type  Chronic pain    Pain Onset  More than a month ago    Pain Frequency  Intermittent                      [facility] Adult PT Treatment/Exercise - 04/14/17 0856      Exercises   Exercises  Lumbar;Knee/Hip      Lumbar Exercises: Stretches   Double Knee to Chest Stretch  2 reps;30 seconds    Lower Trunk Rotation  -- x20      Lumbar Exercises: Aerobic   Stationary Bike  L1 x47min      Lumbar Exercises: Standing   Other Standing Lumbar Exercises  --      Lumbar Exercises: Seated   Other Seated Lumbar Exercises  Fwd Flexion with PBall x3 min      Lumbar Exercises: Supine   Ab Set  5 seconds;5 reps    Pelvic Tilt  15 reps;5 seconds     Bent Knee Raise  20 reps    Bent Knee Raise Limitations  with TA    Bridge  15 reps      Knee/Hip Exercises: Stretches   Hip Flexor Stretch  3 reps;20 seconds;Both    Hip Flexor Stretch Limitations  Kneeling       Manual Therapy   Manual Therapy  Joint mobilization;Soft tissue mobilization;Passive ROM    Joint Mobilization  Thoracic PA mobs; Anterior hip mobs grade 3     Soft tissue mobilization  STM to L Thoracic Paraspinals    Passive ROM  to increase hip extension             PT Education - 04/14/17 0854    Education provided  Yes    Education Details  HEP, posture     Person(s) Educated  Patient    Methods  Explanation    Comprehension  Verbalized understanding;Verbal cues required  PT Short Term Goals - 04/04/17 1015      PT SHORT TERM GOAL #1   Title  Pt to demo independence with initial HEP    Time  2    Period  Weeks    Status  New    Target Date  04/18/17      PT SHORT TERM GOAL #2   Title  Pt to demo decreased pain with standing for 15  min, to 4/10    Time  3    Period  Weeks    Status  New    Target Date  04/25/17        PT Long Term Goals - 04/04/17 1015      PT LONG TERM GOAL #1   Title  Pt to report ability to stand/walk for up to 1 hour, with pain 0-2/10 , to improve ability for IADLS, and community navigation    Time  6    Period  Weeks    Status  New    Target Date  05/16/17      PT LONG TERM GOAL #2   Title  Pt to demo improved lumbar ROM to be WNL, and pain free, to improve ability for ADLs and IADLs.     Time  6    Period  Weeks    Status  New    Target Date  05/16/17      PT LONG TERM GOAL #3   Title  Pt to demo independence with final HEP for stretching and core strengthening, and walking program, for safe return to exercise     Time  6    Period  Weeks    Status  New    Target Date  05/16/17            Plan - 04/14/17 1610    Clinical Impression Statement  Pt with mild improvements in ability for hip  extension today. He is able to feel a stretch with hip flexor stretch. He requires cueing to continuously hold TA during stabilization exercises, which are very challenging for him. Pt with tendency for anterior pelvic tilt with most activities as well as standing posture. Pt with stiffness in thoracic spine, and mild pain at L paraspinals today, joint mobs and STM done to address this.     Rehab Potential  Good    PT Frequency  2x / week    PT Duration  6 weeks    PT Treatment/Interventions  ADLs/Self Care Home Management;Cryotherapy;Electrical Stimulation;Ultrasound;Moist Heat;Gait training;Stair training;Functional mobility training;Therapeutic activities;Therapeutic exercise;Orthotic Fit/Training;Patient/family education;Neuromuscular re-education;Manual techniques;Taping;Dry needling;Passive range of motion    PT Next Visit Plan  --    PT Home Exercise Plan  --       Patient will benefit from skilled therapeutic intervention in order to improve the following deficits and impairments:  Abnormal gait, Pain, Increased muscle spasms, Decreased mobility, Decreased activity tolerance, Decreased endurance, Decreased range of motion, Decreased strength, Hypomobility, Impaired flexibility, Difficulty walking  Visit Diagnosis: Pain in thoracic spine  Chronic low back pain, unspecified back pain laterality, with sciatica presence unspecified     Problem List Patient Active Problem List   Diagnosis Date Noted  . Chronic low back pain 03/29/2017  . Hypercholesteremia 10/25/2016  . Panic disorder 10/22/2016  . Nicotine dependence with current use 10/22/2016  . Elevated blood pressure reading 10/22/2016  . Obesity (BMI 30-39.9) 10/22/2016    Sedalia Muta, PT, DPT 10:02 AM  04/14/17    [place]  [facility] [address] [place], [place], [address] Phone: [phone]   Fax:  [phone]  Name: Carolyne FiscalShannon B Zadrozny MRN: [id number] Date of Birth:  06/21/1970

## 2017-04-18 ENCOUNTER — Ambulatory Visit: Payer: BLUE CROSS/BLUE SHIELD | Admitting: Physical Therapy

## 2017-04-18 DIAGNOSIS — M545 Low back pain: Secondary | ICD-10-CM

## 2017-04-18 DIAGNOSIS — M546 Pain in thoracic spine: Secondary | ICD-10-CM | POA: Diagnosis not present

## 2017-04-18 DIAGNOSIS — G8929 Other chronic pain: Secondary | ICD-10-CM

## 2017-04-18 NOTE — Therapy (Signed)
[facility] [address] [place], [place], [address] Phone: [phone]   Fax:  [phone]  Physical Therapy Treatment  Patient Details  Name: Jim Hatfield MRN: [id number] Date of Birth: [birth year] Referring Provider: [name]   Encounter Date: 04/18/2017  PT End of Session - 04/18/17 1108    Visit Number  4    Number of Visits  12    Date for PT Re-Evaluation  05/16/17    Authorization Type  BCBS    PT Start Time  [phone]    PT Stop Time  0930    PT Time Calculation (min)  56 min    Activity Tolerance  Patient tolerated treatment well       Past Medical History:  Diagnosis Date  . Anxiety   . Cardiac arrhythmia due to congenital heart disease   . Depression   . Diverticulitis   . Diverticulitis   . SVT (supraventricular tachycardia) (HCC)     Past Surgical History:  Procedure Laterality Date  . FOOT SURGERY Left     There were no vitals filed for this visit.  Subjective Assessment - 04/18/17 0839    Subjective  Pt states improvements in pain. He was able to do dishes without pain for the first time in quite some time.     Currently in Pain?  Yes    Pain Score  2     Pain Location  Back    Pain Orientation  Mid;Left    Pain Descriptors / Indicators  Aching    Pain Onset  More than a month ago    Pain Frequency  Intermittent                      [facility] Adult PT Treatment/Exercise - 04/18/17 0840      Exercises   Exercises  Lumbar;Knee/Hip      Lumbar Exercises: Stretches   Double Knee to Chest Stretch  --    Lower Trunk Rotation  -- x20    Lower Trunk Rotation Limitations  with UEs    Piriformis Stretch  2 reps;30 seconds    Piriformis Stretch Limitations  seated    Other Lumbar Stretch Exercise  Childs pose 30 sec x3      Lumbar Exercises: Aerobic   Stationary Bike  L2 x30min      Lumbar Exercises: Seated   Other Seated Lumbar Exercises  Fwd Flexion with PBall x3 min    Other Seated Lumbar  Exercises  Seated thoracic rotation x10      Lumbar Exercises: Supine   Ab Set  --    Pelvic Tilt  5 seconds;20 reps    Clam  20 reps    Clam Limitations  Alternating with GTB and TA    Bent Knee Raise  20 reps    Bent Knee Raise Limitations  with TA    Bridge  15 reps    Other Supine Lumbar Exercises  Modified crunch x20      Lumbar Exercises: Quadruped   Madcat/Old Horse  20 reps    Other Quadruped Lumbar Exercises  Hip extension x10 bil      Knee/Hip Exercises: Stretches   Hip Flexor Stretch  3 reps;20 seconds;Both    Hip Flexor Stretch Limitations  Kneeling       Manual Therapy   Manual Therapy  Joint mobilization;Soft tissue mobilization;Passive ROM    Joint Mobilization  Thoracic PA mobs; Anterior hip mobs grade 3  Soft tissue mobilization  DTM and IASTM to L Thoracic Paraspinals    Passive ROM  to increase hip extension, Assisted thoracic extension and rotation x5             PT Education - 04/18/17 0912    Education provided  Yes    Education Details  HEP progression, starting walking program.     Person(s) Educated  Patient    Methods  Explanation;Demonstration;Handout;Tactile cues    Comprehension  Verbalized understanding       PT Short Term Goals - 04/18/17 1108      PT SHORT TERM GOAL #1   Title  Pt to demo independence with initial HEP    Status  Achieved      PT SHORT TERM GOAL #2   Title  Pt to demo decreased pain with standing for 15  min, to 4/10    Status  Achieved        PT Long Term Goals - 04/04/17 1015      PT LONG TERM GOAL #1   Title  Pt to report ability to stand/walk for up to 1 hour, with pain 0-2/10 , to improve ability for IADLS, and community navigation    Time  6    Period  Weeks    Status  New    Target Date  05/16/17      PT LONG TERM GOAL #2   Title  Pt to demo improved lumbar ROM to be WNL, and pain free, to improve ability for ADLs and IADLs.     Time  6    Period  Weeks    Status  New    Target Date   05/16/17      PT LONG TERM GOAL #3   Title  Pt to demo independence with final HEP for stretching and core strengthening, and walking program, for safe return to exercise     Time  6    Period  Weeks    Status  New    Target Date  05/16/17            Plan - 04/18/17 1109    Clinical Impression Statement  Pt showing improvements in standing and ambulation posture, as well as pain. He has improved tenderness to palpation at L thoracic spine. He does still have tightness and restrictions with DTM to this area, and has pain reproduced with trunk rotation to R. He has improved ability for exercises, but still very challenged with stability and keeping neutral spine. Plan to continue to progress core and hip strength as well as improve mobility.     Rehab Potential  Good    PT Frequency  2x / week    PT Duration  6 weeks    PT Treatment/Interventions  ADLs/Self Care Home Management;Cryotherapy;Electrical Stimulation;Ultrasound;Moist Heat;Gait training;Stair training;Functional mobility training;Therapeutic activities;Therapeutic exercise;Orthotic Fit/Training;Patient/family education;Neuromuscular re-education;Manual techniques;Taping;Dry needling;Passive range of motion       Patient will benefit from skilled therapeutic intervention in order to improve the following deficits and impairments:  Abnormal gait, Pain, Increased muscle spasms, Decreased mobility, Decreased activity tolerance, Decreased endurance, Decreased range of motion, Decreased strength, Hypomobility, Impaired flexibility, Difficulty walking  Visit Diagnosis: Pain in thoracic spine  Chronic low back pain, unspecified back pain laterality, with sciatica presence unspecified     Problem List Patient Active Problem List   Diagnosis Date Noted  . Chronic low back pain 03/29/2017  . Hypercholesteremia 10/25/2016  . Panic disorder 10/22/2016  . Nicotine  dependence with current use 10/22/2016  . Elevated blood pressure  reading 10/22/2016  . Obesity (BMI 30-39.9) 10/22/2016   Sedalia Muta, PT, DPT 11:13 AM  04/18/17    [facility] [address] [place], [place], [address] Phone: [phone]   Fax:  [phone]  Name: Jim Hatfield MRN: [id number] Date of Birth: [birth year]

## 2017-04-21 ENCOUNTER — Ambulatory Visit: Payer: BLUE CROSS/BLUE SHIELD | Admitting: Physical Therapy

## 2017-04-21 ENCOUNTER — Encounter: Payer: Self-pay | Admitting: Physical Therapy

## 2017-04-21 DIAGNOSIS — M546 Pain in thoracic spine: Secondary | ICD-10-CM | POA: Diagnosis not present

## 2017-04-21 NOTE — Therapy (Signed)
[facility] [address] [place], [place], [address] Phone: [phone]   Fax:  [phone]  Physical Therapy Treatment  Patient Details  Name: Jim Hatfield MRN: [id number] Date of Birth: 09/02/1970 Referring Provider: Jimmey RalphParker   Encounter Date: 04/21/2017  PT End of Session - 04/21/17 1321    Visit Number  5    Number of Visits  12    Date for PT Re-Evaluation  05/16/17    Authorization Type  BCBS    PT Start Time  [phone]    PT Stop Time  0940    PT Time Calculation (min)  54 min    Activity Tolerance  Patient tolerated treatment well       Past Medical History:  Diagnosis Date  . Anxiety   . Cardiac arrhythmia due to congenital heart disease   . Depression   . Diverticulitis   . Diverticulitis   . SVT (supraventricular tachycardia) (HCC)     Past Surgical History:  Procedure Laterality Date  . FOOT SURGERY Left     There were no vitals filed for this visit.  Subjective Assessment - 04/21/17 0855    Subjective  Pt has been doing well, with less pain, but this am, he has more pain after walking his dogs.     Currently in Pain?  Yes    Pain Score  5     Pain Location  Back    Pain Orientation  Left;Mid    Pain Descriptors / Indicators  Aching    Pain Type  Chronic pain    Pain Onset  More than a month ago    Pain Frequency  Intermittent                      [facility] Adult PT Treatment/Exercise - 04/21/17 0856      Exercises   Exercises  Lumbar;Knee/Hip      Lumbar Exercises: Stretches   Single Knee to Chest Stretch  3 reps;30 seconds    Lower Trunk Rotation  --    Lower Trunk Rotation Limitations  x10    Piriformis Stretch  --    Piriformis Stretch Limitations  --      Lumbar Exercises: Aerobic   Stationary Bike  L1 x888min      Lumbar Exercises: Standing   Other Standing Lumbar Exercises  Standing QL 30 sec x3 L; Lumbar SB x5 bil      Lumbar Exercises: Seated   Other Seated Lumbar Exercises   Seated FWD flexion    Other Seated Lumbar Exercises  --      Lumbar Exercises: Supine   Pelvic Tilt  5 seconds;20 reps    Clam  --    Clam Limitations  --    Bent Knee Raise  --    Bent Knee Raise Limitations  --    Bridge  --    Other Supine Lumbar Exercises  --      Lumbar Exercises: Quadruped   Madcat/Old Horse  --    Other Quadruped Lumbar Exercises  --      Knee/Hip Exercises: Stretches   Hip Flexor Stretch  --    Hip Flexor Stretch Limitations  --      Modalities   Modalities  Moist Heat;Electrical Stimulation      Moist Heat Therapy   Number Minutes Moist Heat  10 Minutes    Moist Heat Location  Lumbar Spine      Electrical  Stimulation   Electrical Stimulation Location  L low thoracic    Electrical Stimulation Parameters  PreMod 2 pads x12 min    Electrical Stimulation Goals  Pain      Manual Therapy   Manual Therapy  Joint mobilization;Soft tissue mobilization;Passive ROM    Joint Mobilization  Thoracic PA mobs;     Soft tissue mobilization  DTM and IASTM to L Thoracic Paraspinals    Passive ROM  --             PT Education - 04/21/17 0856    Education provided  Yes    Education Details  Pain relief, HEP    Person(s) Educated  Patient    Methods  Explanation    Comprehension  Verbalized understanding       PT Short Term Goals - 04/18/17 1108      PT SHORT TERM GOAL #1   Title  Pt to demo independence with initial HEP    Status  Achieved      PT SHORT TERM GOAL #2   Title  Pt to demo decreased pain with standing for 15  min, to 4/10    Status  Achieved        PT Long Term Goals - 04/04/17 1015      PT LONG TERM GOAL #1   Title  Pt to report ability to stand/walk for up to 1 hour, with pain 0-2/10 , to improve ability for IADLS, and community navigation    Time  6    Period  Weeks    Status  New    Target Date  05/16/17      PT LONG TERM GOAL #2   Title  Pt to demo improved lumbar ROM to be WNL, and pain free, to improve ability for  ADLs and IADLs.     Time  6    Period  Weeks    Status  New    Target Date  05/16/17      PT LONG TERM GOAL #3   Title  Pt to demo independence with final HEP for stretching and core strengthening, and walking program, for safe return to exercise     Time  6    Period  Weeks    Status  New    Target Date  05/16/17            Plan - 04/21/17 1322    Clinical Impression Statement  Pt with increased pain today, at mid thoracic region on L. Pt with increased tightness and tenderness in L paraspinal, addressed with manual therapy today. Ther ex decreased due to pain, and e-stim and heat done to decrease pain. Pt with decreased pain levels after session today, educated on decreasing activity in next 1-2 days. Plan to continue progression as pt tolerated next week.     Rehab Potential  Good    PT Frequency  2x / week    PT Duration  6 weeks    PT Treatment/Interventions  ADLs/Self Care Home Management;Cryotherapy;Electrical Stimulation;Ultrasound;Moist Heat;Gait training;Stair training;Functional mobility training;Therapeutic activities;Therapeutic exercise;Orthotic Fit/Training;Patient/family education;Neuromuscular re-education;Manual techniques;Taping;Dry needling;Passive range of motion       Patient will benefit from skilled therapeutic intervention in order to improve the following deficits and impairments:  Abnormal gait, Pain, Increased muscle spasms, Decreased mobility, Decreased activity tolerance, Decreased endurance, Decreased range of motion, Decreased strength, Hypomobility, Impaired flexibility, Difficulty walking  Visit Diagnosis: Pain in thoracic spine     Problem List Patient Active Problem  List   Diagnosis Date Noted  . Chronic low back pain 03/29/2017  . Hypercholesteremia 10/25/2016  . Panic disorder 10/22/2016  . Nicotine dependence with current use 10/22/2016  . Elevated blood pressure reading 10/22/2016  . Obesity (BMI 30-39.9) 10/22/2016    Sedalia Muta, PT, DPT 1:26 PM  04/21/17    [facility] [address] [place], [place], [address] Phone: [phone]   Fax:  [phone]  Name: Jim Hatfield MRN: [id number] Date of Birth: May 23, 1970

## 2017-04-25 ENCOUNTER — Encounter: Payer: Self-pay | Admitting: Physical Therapy

## 2017-04-25 ENCOUNTER — Ambulatory Visit: Payer: BLUE CROSS/BLUE SHIELD | Admitting: Physical Therapy

## 2017-04-25 DIAGNOSIS — M546 Pain in thoracic spine: Secondary | ICD-10-CM | POA: Diagnosis not present

## 2017-04-25 NOTE — Therapy (Signed)
[facility] [address] [place], [place], [address] Phone: [phone]   Fax:  [phone]  Physical Therapy Treatment  Patient Details  Name: Jim Hatfield MRN: [id number] Date of Birth: 1970/04/07 Referring Provider: [name]   Encounter Date: 04/25/2017  PT End of Session - 04/25/17 1157    Visit Number  6    Number of Visits  12    Date for PT Re-Evaluation  05/16/17    Authorization Type  BCBS    PT Start Time  1045    PT Stop Time  1135    PT Time Calculation (min)  50 min    Activity Tolerance  Patient tolerated treatment well       Past Medical History:  Diagnosis Date  . Anxiety   . Cardiac arrhythmia due to congenital heart disease   . Depression   . Diverticulitis   . Diverticulitis   . SVT (supraventricular tachycardia) (HCC)     Past Surgical History:  Procedure Laterality Date  . FOOT SURGERY Left     There were no vitals filed for this visit.  Subjective Assessment - 04/25/17 1050    Subjective  Pt states decreased pain today, and after last visit, but did have pain over the weekend.     Pain Score  2     Pain Location  Back    Pain Orientation  Mid    Pain Type  Chronic pain    Pain Onset  More than a month ago    Pain Frequency  Intermittent                      [facility] Adult PT Treatment/Exercise - 04/25/17 1049      Exercises   Exercises  Lumbar;Knee/Hip      Lumbar Exercises: Stretches   Single Knee to Chest Stretch  3 reps;30 seconds    Lower Trunk Rotation Limitations  x10    Other Lumbar Stretch Exercise  childs pose 30 sec x3       Lumbar Exercises: Aerobic   Stationary Bike  L2 x54min      Lumbar Exercises: Standing   Other Standing Lumbar Exercises  Standing QL 30 sec x3 L; Lumbar SB x5 bil      Lumbar Exercises: Seated   Other Seated Lumbar Exercises  --      Lumbar Exercises: Supine   Pelvic Tilt  5 seconds;20 reps    Straight Leg Raise  20 reps    Straight  Leg Raises Limitations  with TA    Other Supine Lumbar Exercises  Modified crunch x20      Knee/Hip Exercises: Stretches   Hip Flexor Stretch  3 reps;20 seconds;Both    Hip Flexor Stretch Limitations  Kneeling       Modalities   Modalities  --      Moist Heat Therapy   Moist Heat Location  --      Programme researcher, broadcasting/film/video Location  --    Electrical Stimulation Goals  --      Manual Therapy   Manual Therapy  Joint mobilization;Soft tissue mobilization;Passive ROM    Joint Mobilization  Thoracic PA mobs; Anterior hip mobs    Soft tissue mobilization  DTM and IASTM to L Thoracic Paraspinals    Passive ROM  to increase hip extension, Prone quad stretching              PT Education -  04/25/17 1050    Education provided  Yes    Education Details  HEP, pain relief     Person(s) Educated  Patient    Methods  Explanation    Comprehension  Verbalized understanding       PT Short Term Goals - 04/18/17 1108      PT SHORT TERM GOAL #1   Title  Pt to demo independence with initial HEP    Status  Achieved      PT SHORT TERM GOAL #2   Title  Pt to demo decreased pain with standing for 15  min, to 4/10    Status  Achieved        PT Long Term Goals - 04/04/17 1015      PT LONG TERM GOAL #1   Title  Pt to report ability to stand/walk for up to 1 hour, with pain 0-2/10 , to improve ability for IADLS, and community navigation    Time  6    Period  Weeks    Status  New    Target Date  05/16/17      PT LONG TERM GOAL #2   Title  Pt to demo improved lumbar ROM to be WNL, and pain free, to improve ability for ADLs and IADLs.     Time  6    Period  Weeks    Status  New    Target Date  05/16/17      PT LONG TERM GOAL #3   Title  Pt to demo independence with final HEP for stretching and core strengthening, and walking program, for safe return to exercise     Time  6    Period  Weeks    Status  New    Target Date  05/16/17            Plan -  04/25/17 1158    Clinical Impression Statement  Pt with decreased pain levels overall, but still has tightness and restrictions in L thoracic paraspinal. Dry needling may be helpful for relieving this area. Pt educated on importance of continuing hip flexor stretching, to help to improve standing posture. Light strengthening done today, will continue to progress as pt's pain levels decrease.     Rehab Potential  Good    PT Frequency  2x / week    PT Duration  6 weeks    PT Treatment/Interventions  ADLs/Self Care Home Management;Cryotherapy;Electrical Stimulation;Ultrasound;Moist Heat;Gait training;Stair training;Functional mobility training;Therapeutic activities;Therapeutic exercise;Orthotic Fit/Training;Patient/family education;Neuromuscular re-education;Manual techniques;Taping;Dry needling;Passive range of motion       Patient will benefit from skilled therapeutic intervention in order to improve the following deficits and impairments:  Abnormal gait, Pain, Increased muscle spasms, Decreased mobility, Decreased activity tolerance, Decreased endurance, Decreased range of motion, Decreased strength, Hypomobility, Impaired flexibility, Difficulty walking  Visit Diagnosis: Pain in thoracic spine     Problem List Patient Active Problem List   Diagnosis Date Noted  . Chronic low back pain 03/29/2017  . Hypercholesteremia 10/25/2016  . Panic disorder 10/22/2016  . Nicotine dependence with current use 10/22/2016  . Elevated blood pressure reading 10/22/2016  . Obesity (BMI 30-39.9) 10/22/2016   Sedalia MutaLauren Mechele Kittleson, PT, DPT 12:04 PM  04/25/17    [facility] [address] [place], [place], [address] Phone: [phone]   Fax:  [phone]  Name: Jim Hatfield MRN: [id number] Date of Birth: 12/31/1970

## 2017-04-29 ENCOUNTER — Ambulatory Visit: Payer: BLUE CROSS/BLUE SHIELD | Admitting: Physical Therapy

## 2017-04-29 DIAGNOSIS — M546 Pain in thoracic spine: Secondary | ICD-10-CM | POA: Diagnosis not present

## 2017-04-29 DIAGNOSIS — G8929 Other chronic pain: Secondary | ICD-10-CM

## 2017-04-29 DIAGNOSIS — M545 Low back pain: Secondary | ICD-10-CM | POA: Diagnosis not present

## 2017-04-29 NOTE — Therapy (Signed)
[facility] [address] [place], [place], [address] Phone: [phone]   Fax:  [phone]  Physical Therapy Treatment  Patient Details  Name: Jim Hatfield MRN: [id number] Date of Birth: 05/11/1970 Referring Provider: [name]   Encounter Date: 04/29/2017  PT End of Session - 04/29/17 0833    Visit Number  7    Number of Visits  12    Date for PT Re-Evaluation  05/16/17    Authorization Type  BCBS    PT Start Time  0754    PT Stop Time  0833    PT Time Calculation (min)  39 min    Activity Tolerance  Patient tolerated treatment well    Behavior During Therapy  [facility] for tasks assessed/performed       Past Medical History:  Diagnosis Date  . Anxiety   . Cardiac arrhythmia due to congenital heart disease   . Depression   . Diverticulitis   . Diverticulitis   . SVT (supraventricular tachycardia) (HCC)     Past Surgical History:  Procedure Laterality Date  . FOOT SURGERY Left     There were no vitals filed for this visit.  Subjective Assessment - 04/29/17 0753    Subjective  back is feeling pretty good today.  no pain with standing activities    Patient Stated Goals  decrease pain, increase activity level and start working out to loose weight     Currently in Pain?  No/denies                      [facility] Adult PT Treatment/Exercise - 04/29/17 0758      Lumbar Exercises: Stretches   Other Lumbar Stretch Exercise  seated ball roll out mid and Rt 3x30 sec      Lumbar Exercises: Aerobic   Stationary Bike  L3 x58min      Manual Therapy   Manual Therapy  Soft tissue mobilization    Manual therapy comments  skilled monitoring of soft tissue throughout dry needling    Soft tissue mobilization  DTM and IASTM to L Thoracic Paraspinals       Trigger Point Dry Needling - 04/29/17 0831    Consent Given?  Yes    Education Handout Provided  Yes    Muscles Treated Upper Body  Longissimus and thoracic multifidi    Longissimus Response  Twitch response elicited;Palpable increased muscle length           PT Education - 04/29/17 0833    Education provided  Yes    Education Details  DN, lung field education    Person(s) Educated  Patient    Methods  Explanation;Handout    Comprehension  Verbalized understanding       PT Short Term Goals - 04/18/17 1108      PT SHORT TERM GOAL #1   Title  Pt to demo independence with initial HEP    Status  Achieved      PT SHORT TERM GOAL #2   Title  Pt to demo decreased pain with standing for 15  min, to 4/10    Status  Achieved        PT Long Term Goals - 04/04/17 1015      PT LONG TERM GOAL #1   Title  Pt to report ability to stand/walk for up to 1 hour, with pain 0-2/10 , to improve ability for IADLS, and community navigation    Time  6  Period  Weeks    Status  New    Target Date  05/16/17      PT LONG TERM GOAL #2   Title  Pt to demo improved lumbar ROM to be WNL, and pain free, to improve ability for ADLs and IADLs.     Time  6    Period  Weeks    Status  New    Target Date  05/16/17      PT LONG TERM GOAL #3   Title  Pt to demo independence with final HEP for stretching and core strengthening, and walking program, for safe return to exercise     Time  6    Period  Weeks    Status  New    Target Date  05/16/17            Plan - 04/29/17 0834    Clinical Impression Statement  Pt tolerated DN well today with decreased tightness noted following DN and manual therapy.  Progressing well, and reporting decreasing pain levels with functional activities.      PT Treatment/Interventions  ADLs/Self Care Home Management;Cryotherapy;Electrical Stimulation;Ultrasound;Moist Heat;Gait training;Stair training;Functional mobility training;Therapeutic activities;Therapeutic exercise;Orthotic Fit/Training;Patient/family education;Neuromuscular re-education;Manual techniques;Taping;Dry needling;Passive range of motion    PT Next Visit Plan   Review and progress HEP, Manual; assess response to DN    Consulted and Agree with Plan of Care  Patient       Patient will benefit from skilled therapeutic intervention in order to improve the following deficits and impairments:  Abnormal gait, Pain, Increased muscle spasms, Decreased mobility, Decreased activity tolerance, Decreased endurance, Decreased range of motion, Decreased strength, Hypomobility, Impaired flexibility, Difficulty walking  Visit Diagnosis: Pain in thoracic spine  Chronic low back pain, unspecified back pain laterality, with sciatica presence unspecified     Problem List Patient Active Problem List   Diagnosis Date Noted  . Chronic low back pain 03/29/2017  . Hypercholesteremia 10/25/2016  . Panic disorder 10/22/2016  . Nicotine dependence with current use 10/22/2016  . Elevated blood pressure reading 10/22/2016  . Obesity (BMI 30-39.9) 10/22/2016      Clarita CraneStephanie F Adyen Bifulco, PT, DPT 04/29/17 8:36 AM    [facility] [address] [place], [place], [address] Phone: [phone]   Fax:  [phone]  Name: Carolyne FiscalShannon B Kleven MRN: [id number] Date of Birth: 10/04/1970

## 2017-04-29 NOTE — Patient Instructions (Signed)

## 2017-05-02 ENCOUNTER — Encounter: Payer: Self-pay | Admitting: Family Medicine

## 2017-05-02 ENCOUNTER — Ambulatory Visit: Payer: BLUE CROSS/BLUE SHIELD | Admitting: Physical Therapy

## 2017-05-02 ENCOUNTER — Ambulatory Visit: Payer: BLUE CROSS/BLUE SHIELD | Admitting: Family Medicine

## 2017-05-02 DIAGNOSIS — G8929 Other chronic pain: Secondary | ICD-10-CM | POA: Diagnosis not present

## 2017-05-02 DIAGNOSIS — M546 Pain in thoracic spine: Secondary | ICD-10-CM

## 2017-05-02 DIAGNOSIS — E669 Obesity, unspecified: Secondary | ICD-10-CM

## 2017-05-02 DIAGNOSIS — L989 Disorder of the skin and subcutaneous tissue, unspecified: Secondary | ICD-10-CM | POA: Diagnosis not present

## 2017-05-02 DIAGNOSIS — R030 Elevated blood-pressure reading, without diagnosis of hypertension: Secondary | ICD-10-CM | POA: Diagnosis not present

## 2017-05-02 DIAGNOSIS — M545 Low back pain: Secondary | ICD-10-CM

## 2017-05-02 MED ORDER — MELOXICAM 15 MG PO TABS: 15.0000 mg | ORAL_TABLET | Freq: Every day | ORAL | 0 refills | Status: DC

## 2017-05-02 NOTE — Patient Instructions (Signed)
I am glad you are doing well.  We do not me to make any changes today.  I will send in a refill for your Mobic.  Please use this as needed if your back pain flares up.  Please continue with the full dose of Saxenda.  I think you have a cyst on your arm.  We may need to eventually remove this.  Please let me know if it enlarges, becomes more red, or more painful.  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  This is a benign condition.  Come back to see me in 6-8 weeks, or sooner as needed  Take care, Dr. [name] Diastasis Recti Diastasis recti is when the muscles of the abdomen (rectus abdominis muscles) become thin and separate. The result is a wider space between the right and left abdomen (abdominal) muscles. This wider space between the muscles may cause a bulge in the middle of your abdomen. You may notice this bulge when you are straining or when you sit up from a lying down position. Diastasis recti can affect men and women. It is most common among pregnant women, infants, people who are obese, and people who have had abdominal surgery. Exercise or surgical treatment may help correct it. What are the causes? Common causes of this condition include:  Pregnancy. The growing uterus puts pressure on the abdominal muscles, which causes the muscles to separate.  Obesity. Excess fat puts pressure on abdominal muscles.  Weightlifting.  Some abdomen exercises.  Advanced age.  Genetics.  Prior abdominal surgery.  What increases the risk? This condition is more likely to develop in:  Women.  Newborns, especially newborns who are born early (prematurely).  What are the signs or symptoms? Common symptoms of this condition include:  A bulge in the middle of the abdomen. You will notice it most when you sit up or strain.  Pain in the low back, pelvis, or hips.  Constipation.  Inability to control when you urinate (urinary incontinence).  Bloating.  Poor posture.  How is this  diagnosed? This condition is diagnosed with a physical exam. Your health care provider will ask you to lie flat on your back and do a crunch or half sit-up. If you have diastasis recti, a vertical bulge will appear between your abdominal muscles in the center of your abdomen. Your health care provider will measure the gap between your muscles with one of the following:  A medical device used to measure the space between two objects (caliper).  A tape measure.  CT scan.  Ultrasound.  Finger spaces. Your health care provider will measure the space using their fingers.  How is this treated? If your muscle separation is not too large, you may not need treatment. However, if you are a woman who plans to become pregnant again, you should treat this condition before your next pregnancy. Treatment may include:  Physical therapy to strengthen and tighten your abdominal muscles.  Lifestyle changes such as weight loss and exercise.  Over-the-counter pain medicines as needed.  Surgery to correct the separation.  Follow these instructions at home: Activity  Return to your normal activities as told by your health care provider. Ask your health care provider what activities are safe for you.  When lifting weights or doing exercises using your abdominal muscles or the muscles in the center of your body that give stability (core muscles), make sure you are doing your exercises and movements correctly. Proper form can help to prevent the condition from happening again. General  instructions  If you are overweight, ask your health care provider for help with weight loss. Losing even a small amount of weight can help to improve your diastasis recti.  Take over-the-counter or prescription medicines only as told by your health care provider.  Do not strain. Straining can make the separation worse. Examples of straining include: ? Pushing hard to have a bowel movement, such as due to  constipation. ? Lifting heavy objects, including children. ? Standing up and sitting down.  Take steps to prevent constipation: ? Drink enough fluid to keep your urine clear or pale yellow. ? Take over-the-counter or prescription medicines only as directed. ? Eat foods that are high in fiber, such as fresh fruits and vegetables, whole grains, and beans. ? Limit foods that are high in fat and processed sugars, such as fried and sweet foods. Contact a health care provider if:  You notice a new bulge in your abdomen. Get help right away if:  You experience severe discomfort in your abdomen.  You develop severe abdominal pain along with nausea, vomiting, or fever. Summary  Diastasis recti is when the abdomen (abdominal) muscles become thin and separate. Your abdomen will stick out because the space between your right and left abdomen muscles has widened.  The most common symptom is a bulge in your abdomen. You will notice it most when you sit up or are straining.  This condition is diagnosed during a physical exam.  If the abdomen separation is not too big, you may choose not to have treatment. Otherwise, you may need to undergo physical therapy or surgery. This information is not intended to replace advice given to you by your health care provider. Make sure you discuss any questions you have with your health care provider. Document Released: 04/26/2016 Document Revised: 04/26/2016 Document Reviewed: 04/26/2016 Elsevier Interactive Patient Education  [employer]2018 [employer]vier Inc[employer]

## 2017-05-02 NOTE — Assessment & Plan Note (Signed)
Doing much better.  Continue physical therapy.  We will refill Mobic for use as needed.  Anticipate continued improvement as patient continues to lose weight.

## 2017-05-02 NOTE — Assessment & Plan Note (Signed)
Much improved today.  This is likely secondary to his increased activity well as well as his weight loss.  Encouraged continued lifestyle modifications.  Follow-up in 6-8 weeks.

## 2017-05-02 NOTE — Therapy (Signed)
[facility] [address] [place], [place], [address] Phone: [phone]   Fax:  [phone]  Physical Therapy Treatment  Patient Details  Name: Jim Hatfield MRN: [id number] Date of Birth: 01/22/1971 Referring Provider: Jimmey RalphParker   Encounter Date: 05/02/2017  PT End of Session - 05/02/17 0810    Visit Number  8    Number of Visits  12    Date for PT Re-Evaluation  05/16/17    Authorization Type  BCBS    PT Start Time  0802    PT Stop Time  0852    PT Time Calculation (min)  50 min    Activity Tolerance  Patient tolerated treatment well    Behavior During Therapy  [facility] for tasks assessed/performed       Past Medical History:  Diagnosis Date  . Anxiety   . Cardiac arrhythmia due to congenital heart disease   . Depression   . Diverticulitis   . Diverticulitis   . SVT (supraventricular tachycardia) (HCC)     Past Surgical History:  Procedure Laterality Date  . FOOT SURGERY Left     There were no vitals filed for this visit.  Subjective Assessment - 05/02/17 0809    Subjective  Pt states that he is not in pain today. He tolerated dry needling well.     Patient Stated Goals  decrease pain, increase activity level and start working out to loose weight     Currently in Pain?  No/denies    Pain Score  0-No pain                      [facility] Adult PT Treatment/Exercise - 05/02/17 0811      Exercises   Exercises  Lumbar;Knee/Hip      Lumbar Exercises: Stretches   Single Knee to Chest Stretch  --    Lower Trunk Rotation Limitations  x10    Other Lumbar Stretch Exercise  Fwd flexion wiht ball -roll outs       Lumbar Exercises: Aerobic   Stationary Bike  L3  x618min      Lumbar Exercises: Standing   Other Standing Lumbar Exercises  Lumbar SB x10 bil;  Lumbar extension x20      Lumbar Exercises: Supine   Pelvic Tilt  5 seconds;20 reps    Bridge  20 reps    Straight Leg Raise  --    Straight Leg Raises  Limitations  --    Other Supine Lumbar Exercises  Modified crunch x20      Knee/Hip Exercises: Stretches   Hip Flexor Stretch  3 reps;Both;30 seconds    Hip Flexor Stretch Limitations  Kneeling       Manual Therapy   Manual Therapy  Joint mobilization;Soft tissue mobilization;Passive ROM    Manual therapy comments  --    Joint Mobilization  Thoracic PA mobs;     Soft tissue mobilization  DTM and IASTM to L Thoracic Paraspinals    Passive ROM  to increase hip extension, Prone quad stretching              PT Education - 05/02/17 0810    Education provided  Yes    Education Details  HEP, activity progression     Person(s) Educated  Patient    Methods  Explanation    Comprehension  Verbalized understanding       PT Short Term Goals - 05/02/17 0907      PT  SHORT TERM GOAL #1   Title  Pt to demo independence with initial HEP    Status  Achieved      PT SHORT TERM GOAL #2   Title  Pt to demo decreased pain with standing for 15  min, to 4/10    Status  Achieved        PT Long Term Goals - 04/04/17 1015      PT LONG TERM GOAL #1   Title  Pt to report ability to stand/walk for up to 1 hour, with pain 0-2/10 , to improve ability for IADLS, and community navigation    Time  6    Period  Weeks    Status  New    Target Date  05/16/17      PT LONG TERM GOAL #2   Title  Pt to demo improved lumbar ROM to be WNL, and pain free, to improve ability for ADLs and IADLs.     Time  6    Period  Weeks    Status  New    Target Date  05/16/17      PT LONG TERM GOAL #3   Title  Pt to demo independence with final HEP for stretching and core strengthening, and walking program, for safe return to exercise     Time  6    Period  Weeks    Status  New    Target Date  05/16/17            Plan - 05/02/17 0908    Clinical Impression Statement  Pt with improving mobility and decreasing pain. He continues to have significant limitations with hip extension, due to tightness in hip  flexors, which effects lumbar posture. He is progressing well, will benefit from continued care.     PT Treatment/Interventions  ADLs/Self Care Home Management;Cryotherapy;Electrical Stimulation;Ultrasound;Moist Heat;Gait training;Stair training;Functional mobility training;Therapeutic activities;Therapeutic exercise;Orthotic Fit/Training;Patient/family education;Neuromuscular re-education;Manual techniques;Taping;Dry needling;Passive range of motion    PT Next Visit Plan  Review and progress HEP, Manual; assess response to DN    Consulted and Agree with Plan of Care  Patient       Patient will benefit from skilled therapeutic intervention in order to improve the following deficits and impairments:  Abnormal gait, Pain, Increased muscle spasms, Decreased mobility, Decreased activity tolerance, Decreased endurance, Decreased range of motion, Decreased strength, Hypomobility, Impaired flexibility, Difficulty walking  Visit Diagnosis: Pain in thoracic spine     Problem List Patient Active Problem List   Diagnosis Date Noted  . Chronic low back pain 03/29/2017  . Hypercholesteremia 10/25/2016  . Panic disorder 10/22/2016  . Nicotine dependence with current use 10/22/2016  . Elevated blood pressure reading 10/22/2016  . Obesity (BMI 30-39.9) 10/22/2016   Sedalia Muta, PT, DPT 9:10 AM  05/02/17    [facility] [address] [place], [place], [address] Phone: [phone]   Fax:  [phone]  Name: Jim Hatfield MRN: [id number] Date of Birth: [birth year]

## 2017-05-02 NOTE — Progress Notes (Signed)
    Subjective:  Jim Hatfield is a 47 y.o. male who presents today with a chief complaint of Obesity follow up.   HPI:  Obesity, established problem, stable Patient seen about a month ago for this.  At that time was started on Saxenda.  He has done well with this without any obvious side effects.  Patient is concerned that he is not getting the full dose of his Saxenda as he has not been waiting until the second click during his injection.  Does not think he got the full dose until this morning.  He has done well with his injections.  He is also been able to exercise more as his low back pain has been improving.  Is been able to cook at home more as well.  Chronic low back pain, established problem, improving Patient also seen about a month ago for this.  At that time was started on meloxicam and refer to physical therapy.  He has noted significant improvement over last several weeks.  He is no longer taking meloxicam.  No urinary retention.  No bowel or bladder incontinence.  No lower extremity weakness or numbness.  Cutaneous abscess, established problem, improving Patient also seen about a month ago for this.  That time patient deferred I&D.  He started doxycycline.  Symptoms have significantly improved over last several weeks.  Still has a little bit of swelling to the area.  No pain.  No redness.  No drainage.  ROS: Per HPI  PMH: He reports that he has been smoking.  He has been smoking about 0.50 packs per day. he has never used smokeless tobacco. He reports that he drinks alcohol. He reports that he does not use drugs.   Objective:  Physical Exam: BP 110/74   Pulse 97   Temp 98 F (36.7 C) (Oral)   Ht 6' (1.829 m)   Wt 245 lb 9.6 oz (111.4 kg)   SpO2 95%   BMI 33.31 kg/m   Gen: NAD, resting comfortably CV: RRR with no murmurs appreciated Pulm: NWOB, CTAB with no crackles, wheezes, or rhonchi GI: Obese, normal bowel sounds present. Soft, Nontender, Nondistended.  Rectus  diastases noted. Skin: Approximately 2 mm cystic lesion on left upper extremity.  No erythema.  Nontender to palpation.  No drainage.  Assessment/Plan:  Obesity (BMI 30-39.9) Doing well on Saxenda.  We will continue this.  He is down about 8 pounds over the last month which is about a 5% loss of body weight from his baseline of 253.  He will follow-up with me in 6-8 weeks.  Encouraged to continue lifestyle modifications.  Consider nutrition referral in the future.  Chronic low back pain Doing much better.  Continue physical therapy.  We will refill Mobic for use as needed.  Anticipate continued improvement as patient continues to lose weight.  Skin lesion Most consistent with sebaceous cyst.  Patient will continue with watchful waiting for now.  May ultimately need excision in the future.  Elevated blood pressure reading Much improved today.  This is likely secondary to his increased activity well as well as his weight loss.  Encouraged continued lifestyle modifications.  Follow-up in 6-8 weeks.  Katina Degreealeb M. Jimmey RalphParker, MD 05/02/2017 9:29 AM

## 2017-05-02 NOTE — Assessment & Plan Note (Signed)
Doing well on Saxenda.  We will continue this.  He is down about 8 pounds over the last month which is about a 5% loss of body weight from his baseline of 253.  He will follow-up with me in 6-8 weeks.  Encouraged to continue lifestyle modifications.  Consider nutrition referral in the future.

## 2017-05-02 NOTE — Assessment & Plan Note (Signed)
Most consistent with sebaceous cyst.  Patient will continue with watchful waiting for now.  May ultimately need excision in the future.

## 2017-05-06 ENCOUNTER — Ambulatory Visit: Payer: BLUE CROSS/BLUE SHIELD | Admitting: Physical Therapy

## 2017-05-06 ENCOUNTER — Encounter: Payer: Self-pay | Admitting: Physical Therapy

## 2017-05-06 DIAGNOSIS — G8929 Other chronic pain: Secondary | ICD-10-CM

## 2017-05-06 DIAGNOSIS — M545 Low back pain: Secondary | ICD-10-CM

## 2017-05-06 DIAGNOSIS — M546 Pain in thoracic spine: Secondary | ICD-10-CM

## 2017-05-06 NOTE — Therapy (Signed)
[facility] [address] [place], [place], [address] Phone: [phone]   Fax:  [phone]  Physical Therapy Treatment  Patient Details  Name: Jim Hatfield MRN: [id number] Date of Birth: 01/05/1971 Referring Provider: Jimmey RalphParker   Encounter Date: 05/06/2017  PT End of Session - 05/06/17 0839    Visit Number  9    Number of Visits  12    Date for PT Re-Evaluation  05/16/17    Authorization Type  BCBS    PT Start Time  0758    PT Stop Time  0831    PT Time Calculation (min)  33 min    Activity Tolerance  Patient tolerated treatment well    Behavior During Therapy  [facility] assessed/per[facility]       Past Medical History:  Diagnosis Date  . Anxiety   . Cardiac arrhythmia due to congenital heart disease   . Depression   . Diverticulitis   . Diverticulitis   . SVT (supraventricular tachycardia) (HCC)     Past Surgical History:  Procedure Laterality Date  . FOOT SURGERY Left     There were no vitals filed for this visit.  Subjective Assessment - 05/06/17 0801    Subjective  DN went well, no pain or soreness following.  Doing well since DN visit. No pain x 10-14 days.    Patient Stated Goals  decrease pain, increase activity level and start working out to lose weight     Currently in Pain?  No/denies                      [facility] Treatment/Exercise - 05/06/17 0802      Self-Care   Self-Care  Other Self-Care Comments    Other Self-Care Comments   discussed current progress (may not need additional DN sessions if conts to be pain free), gradual return to basketball and running; pt verbalized understanding      Lumbar Exercises: Aerobic   Stationary Bike  L3  x78min      Manual Therapy   Manual Therapy  Soft tissue mobilization    Soft tissue mobilization  DTM and IASTM to L Thoracic Paraspinals       Trigger Point Dry Needling - 05/06/17 [id number]    Consent Given?  Yes    Muscles Treated Upper Body   Longissimus thoracic multifidi    Longissimus Response  Palpable increased muscle length;Twitch response elicited           PT Education - 05/06/17 0839    Education provided  Yes    Education Details  see self care    Person(s) Educated  Patient    Methods  Explanation    Comprehension  Verbalized understanding       PT Short Term Goals - 05/02/17 0907      PT SHORT TERM GOAL #1   Title  Pt to demo independence with initial HEP    Status  Achieved      PT SHORT TERM GOAL #2   Title  Pt to demo decreased pain with standing for 15  min, to 4/10    Status  Achieved        PT Long Term Goals - 04/04/17 1015      PT LONG TERM GOAL #1   Title  Pt to report ability to stand/walk for up to 1 hour, with pain 0-2/10 , to improve ability for IADLS, and community navigation  Time  6    Period  Weeks    Status  New    Target Date  05/16/17      PT LONG TERM GOAL #2   Title  Pt to demo improved lumbar ROM to be WNL, and pain free, to improve ability for ADLs and IADLs.     Time  6    Period  Weeks    Status  New    Target Date  05/16/17      PT LONG TERM GOAL #3   Title  Pt to demo independence with final HEP for stretching and core strengthening, and walking program, for safe return to exercise     Time  6    Period  Weeks    Status  New    Target Date  05/16/17            Plan - 05/06/17 0840    Clinical Impression Statement  Pt tolerated session well and reports 10-14 days of no pain.  Anticipate if pt continues to be pain free will be ready for d/c soon, and may not need additional DN sessions.  Pt advised on gradual return to other exercises (basketball, jogging) that he stopped initially due to pain.    PT Treatment/Interventions  ADLs/Self Care Home Management;Cryotherapy;Electrical Stimulation;Ultrasound;Moist Heat;Gait training;Stair training;Functional mobility training;Therapeutic activities;Therapeutic exercise;Orthotic Fit/Training;Patient/family  education;Neuromuscular re-education;Manual techniques;Taping;Dry needling;Passive range of motion    PT Next Visit Plan  Review and progress HEP, Manual; assess response to DN    Consulted and Agree with Plan of Care  Patient       Patient will benefit from skilled therapeutic intervention in order to improve the following deficits and impairments:  Abnormal gait, Pain, Increased muscle spasms, Decreased mobility, Decreased activity tolerance, Decreased endurance, Decreased range of motion, Decreased strength, Hypomobility, Impaired flexibility, Difficulty walking  Visit Diagnosis: Pain in thoracic spine  Chronic low back pain, unspecified back pain laterality, with sciatica presence unspecified     Problem List Patient Active Problem List   Diagnosis Date Noted  . Skin lesion 05/02/2017  . Chronic low back pain 03/29/2017  . Hypercholesteremia 10/25/2016  . Panic disorder 10/22/2016  . Nicotine dependence with current use 10/22/2016  . Elevated blood pressure reading 10/22/2016  . Obesity (BMI 30-39.9) 10/22/2016      Clarita Crane, PT, DPT 05/06/17 8:42 AM    [facility] [address] [place], [place], [address] Phone: [phone]   Fax:  [phone]  Name: Jim Hatfield MRN: [id number] Date of Birth: April 11, 1970

## 2017-05-09 ENCOUNTER — Ambulatory Visit: Payer: BLUE CROSS/BLUE SHIELD | Admitting: Physical Therapy

## 2017-05-09 ENCOUNTER — Encounter: Payer: Self-pay | Admitting: Physical Therapy

## 2017-05-09 DIAGNOSIS — G8929 Other chronic pain: Secondary | ICD-10-CM

## 2017-05-09 DIAGNOSIS — M546 Pain in thoracic spine: Secondary | ICD-10-CM | POA: Diagnosis not present

## 2017-05-09 DIAGNOSIS — M545 Low back pain: Secondary | ICD-10-CM | POA: Diagnosis not present

## 2017-05-09 NOTE — Therapy (Addendum)
[facility] [address] [place], [place], [address] Phone: [phone]   Fax:  [phone]  Physical Therapy Treatment  Patient Details  Name: Jim Hatfield MRN: [id number] Date of Birth: 03-01-1971 Referring Provider: Jerline Pain   Encounter Date: 05/09/2017  PT End of Session - 05/09/17 1317    Visit Number  10    Number of Visits  12    Date for PT Re-Evaluation  05/16/17    Authorization Type  BCBS    PT Start Time  1305    PT Stop Time  1345    PT Time Calculation (min)  40 min    Activity Tolerance  Patient tolerated treatment well    Behavior During Therapy  [facility] for tasks assessed/performed       Past Medical History:  Diagnosis Date  . Anxiety   . Cardiac arrhythmia due to congenital heart disease   . Depression   . Diverticulitis   . Diverticulitis   . SVT (supraventricular tachycardia) (HCC)     Past Surgical History:  Procedure Laterality Date  . FOOT SURGERY Left     There were no vitals filed for this visit.  Subjective Assessment - 05/09/17 1311    Subjective  Pt states decreased pain, has had several pain free days, doing increased activity at home.     Limitations  Standing;Walking    Currently in Pain?  No/denies    Pain Type  Chronic pain    Pain Onset  More than a month ago    Pain Frequency  Intermittent         [facility] PT Assessment - 05/09/17 0001      AROM   Lumbar Flexion  WFL    Lumbar Extension  mild deficit    Lumbar - Right Side Bend  WFL    Lumbar - Left Side Bend  mild deficit                  [facility] Adult PT Treatment/Exercise - 05/09/17 1324      Exercises   Exercises  Lumbar;Knee/Hip      Lumbar Exercises: Stretches   Lower Trunk Rotation Limitations  --    Other Lumbar Stretch Exercise  Standing QL stretch    Other Lumbar Stretch Exercise  FWD flexion L /R/ Center       Lumbar Exercises: Aerobic   Stationary Bike  L3  x57mn      Lumbar Exercises: Standing   Other Standing Lumbar Exercises  --      Lumbar Exercises: Supine   Pelvic Tilt  5 seconds;20 reps    Clam  --    Clam Limitations  --    Dead Bug  20 reps    Bridge  --    Straight Leg Raise  20 reps    Straight Leg Raises Limitations  with TA    Other Supine Lumbar Exercises  --      Knee/Hip Exercises: Stretches   Hip Flexor Stretch  3 reps;Both;30 seconds    Hip Flexor Stretch Limitations  Kneeling     Other Knee/Hip Stretches  Hip flexor stretch, thomas test position x3 min      Knee/Hip Exercises: Standing   Hip Extension  20 reps;Both      Manual Therapy   Manual Therapy  Joint mobilization;Soft tissue mobilization;Passive ROM    Joint Mobilization  --    Soft tissue mobilization  --    Passive ROM  to increase hip extension, Prone quad stretching              PT Education - 05/09/17 1316    Education provided  Yes    Education Details  HEP    Person(s) Educated  Patient    Methods  Explanation    Comprehension  Verbalized understanding       PT Short Term Goals - 05/02/17 0907      PT SHORT TERM GOAL #1   Title  Pt to demo independence with initial HEP    Status  Achieved      PT SHORT TERM GOAL #2   Title  Pt to demo decreased pain with standing for 15  min, to 4/10    Status  Achieved        PT Long Term Goals - 04/04/17 1015      PT LONG TERM GOAL #1   Title  Pt to report ability to stand/walk for up to 1 hour, with pain 0-2/10 , to improve ability for IADLS, and community navigation    Time  6    Period  Weeks    Status Achieved   Target Date  05/16/17      PT LONG TERM GOAL #2   Title  Pt to demo improved lumbar ROM to be WNL, and pain free, to improve ability for ADLs and IADLs.     Time  6    Period  Weeks    Status Achieved   Target Date  05/16/17      PT LONG TERM GOAL #3   Title  Pt to demo independence with final HEP for stretching and core strengthening, and walking program, for safe return to exercise     Time  6     Period  Weeks     Achieved   Target Date  05/16/17            Plan - 05/09/17 1624    Clinical Impression Statement  Pt progressing well. Plan to see pt for one more visit next week, and d/c to HEP. Pt with improving pain, and improving tightness. He does demonstrate limitations with tightness in hip flexors, but has improved standing posture and ambulation posture.     PT Treatment/Interventions  ADLs/Self Care Home Management;Cryotherapy;Electrical Stimulation;Ultrasound;Moist Heat;Gait training;Stair training;Functional mobility training;Therapeutic activities;Therapeutic exercise;Orthotic Fit/Training;Patient/family education;Neuromuscular re-education;Manual techniques;Taping;Dry needling;Passive range of motion    PT Next Visit Plan  Review and progress HEP, Manual; assess response to DN    Consulted and Agree with Plan of Care  Patient       Patient will benefit from skilled therapeutic intervention in order to improve the following deficits and impairments:  Abnormal gait, Pain, Increased muscle spasms, Decreased mobility, Decreased activity tolerance, Decreased endurance, Decreased range of motion, Decreased strength, Hypomobility, Impaired flexibility, Difficulty walking  Visit Diagnosis: Pain in thoracic spine  Chronic low back pain, unspecified back pain laterality, with sciatica presence unspecified     Problem List Patient Active Problem List   Diagnosis Date Noted  . Skin lesion 05/02/2017  . Chronic low back pain 03/29/2017  . Hypercholesteremia 10/25/2016  . Panic disorder 10/22/2016  . Nicotine dependence with current use 10/22/2016  . Elevated blood pressure reading 10/22/2016  . Obesity (BMI 30-39.9) 10/22/2016   Lyndee Hensen, PT, DPT 4:26 PM  05/09/17    [facility] [place], [place], [address] Phone: [phone]   Fax:  [phone]  Name:  Jim Hatfield MRN: [id number] Date of Birth:  [birth year]  PHYSICAL THERAPY DISCHARGE SUMMARY  Visits from Start of Care: 10 Pt did not return for last visit, due to being sick. Pt had met PT goals.   Plan: Patient agrees to discharge.  Patient goals were met. Patient is being discharged due to meeting the stated rehab goals.  ?????        Lyndee Hensen, PT, DPT 12:01 PM  05/25/17

## 2017-06-20 ENCOUNTER — Other Ambulatory Visit: Payer: Self-pay

## 2017-06-20 ENCOUNTER — Encounter: Payer: Self-pay | Admitting: Family Medicine

## 2017-06-20 ENCOUNTER — Ambulatory Visit (INDEPENDENT_AMBULATORY_CARE_PROVIDER_SITE_OTHER): Payer: BLUE CROSS/BLUE SHIELD | Admitting: Family Medicine

## 2017-06-20 VITALS — BP 128/78 | HR 79 | Temp 98.7°F | Resp 14 | Ht 73.0 in | Wt 226.0 lb

## 2017-06-20 DIAGNOSIS — F17200 Nicotine dependence, unspecified, uncomplicated: Secondary | ICD-10-CM | POA: Diagnosis not present

## 2017-06-20 DIAGNOSIS — E669 Obesity, unspecified: Secondary | ICD-10-CM

## 2017-06-20 DIAGNOSIS — G8929 Other chronic pain: Secondary | ICD-10-CM

## 2017-06-20 DIAGNOSIS — M545 Low back pain: Secondary | ICD-10-CM | POA: Diagnosis not present

## 2017-06-20 DIAGNOSIS — H61893 Other specified disorders of external ear, bilateral: Secondary | ICD-10-CM | POA: Diagnosis not present

## 2017-06-20 NOTE — Patient Instructions (Signed)
I am glad that you are doing well!  We will not make any changes today.  Good luck with stopping smoking!  Come back to see me in 3 months, or sooner as needed.  Take care, Dr Jimmey RalphParker

## 2017-06-20 NOTE — Progress Notes (Signed)
   Subjective:  Jim Hatfield is a 47 y.o. male who presents today with a chief complaint of obesity.   HPI:  Obesity, established problem, Stable Patient has lost about 20 pounds over the last 2 months.  Currently on Saxenda 3 mg daily.  Tolerates this well without side effects.  Thinks that this is helped him significantly with his weight loss.  He is also been much more active lately and trying to eat more healthy.  Ear canal irritation, new problem Patient with bilateral ear canal irritation for the past few days.  Per patient, this usually occurs once yearly during pollen season.  He has tried using a little bit of alcohol in his ear to see if this helps, however it has not.  No reported discharge or hearing loss.  Low back pain, established problem, stable Symptoms have significantly improved.  Has not needed to take any Mobic.  Nicotine dependence, established problem, stable Patient down to about half a pack per day.  Is planning on quitting cold Mal[place]urkey later this week.  He has tried Chantix in the past however did not tolerate due to side effects including parasomnias.  ROS: Per HPI  PMH: He reports that he has been smoking.  He has been smoking about 0.50 packs per day. He has never used smokeless tobacco. He reports that he drinks alcohol. He reports that he does not use drugs.   Objective:  Physical Exam: BP 128/78 (BP Location: Left Arm)   Pulse 79   Temp 98.7 F (37.1 C) (Oral)   Resp 14   Ht 6\' 1"  (1.854 m)   Wt 226 lb (102.5 kg)   SpO2 96%   BMI 29.82 kg/m   Wt Readings from Last 3 Encounters:  06/20/17 226 lb (102.5 kg)  05/02/17 245 lb 9.6 oz (111.4 kg)  03/29/17 253 lb 9.6 oz (115 kg)  Gen: NAD, resting comfortably HEENT: EAC with mild irritation bilaterally.  TMs clear bilaterally. CV: RRR with no murmurs appreciated Pulm: NWOB, CTAB with no crackles, wheezes, or rhonchi GI: Normal bowel sounds present. Soft, Nontender, Nondistended. MSK: No edema,  cyanosis, or clubbing noted Skin: Warm, dry Neuro: Grossly normal, moves all extremities Psych: Normal affect and thought content  Assessment/Plan:  Obesity (BMI 30-39.9) Patient down about 20 pounds compared to 2 months ago.  Congratulated patient on his weight loss.  Encouraged him to continue with regular exercise and healthy diet.  We will continue Saxenda 3 mg daily.  He will follow-up with me in 3 months.  Chronic low back pain Stable.  Despite continued improvement as patient continues to lose weight.  Continue Mobic as needed.  Nicotine dependence with current use Patient was asked about his tobacco use today and was strongly advised to quit. Patient is currently contemplative. We reviewed treatment options to assist him quit smoking. He will be attempting to quit cold [place]urkey later this week.   Total time spent counseling approximately 3 minutes.   Ear Canal Irritation Likely secondary to dry skin based on his physical exam.  Advised patient to place a small amount of lotion once or twice daily to the area to see if this helps.  Could possibly have a tinea infection-if no improvement with emollients, would consider topical antifungal.  Preventative Healthcare Tdap given today.   Katina Degreealeb M. Jimmey RalphParker, MD 06/20/2017 9:37 AM

## 2017-06-20 NOTE — Assessment & Plan Note (Signed)
Patient was asked about his tobacco use today and was strongly advised to quit. Patient is currently contemplative. We reviewed treatment options to assist him quit smoking. He will be attempting to quit cold [place]urkey later this week.   Total time spent counseling approximately 3 minutes.

## 2017-06-20 NOTE — Assessment & Plan Note (Signed)
Patient down about 20 pounds compared to 2 months ago.  Congratulated patient on his weight loss.  Encouraged him to continue with regular exercise and healthy diet.  We will continue Saxenda 3 mg daily.  He will follow-up with me in 3 months.

## 2017-06-20 NOTE — Assessment & Plan Note (Signed)
Stable.  Despite continued improvement as patient continues to lose weight.  Continue Mobic as needed.

## 2017-07-22 ENCOUNTER — Other Ambulatory Visit: Payer: Self-pay | Admitting: Family Medicine

## 2017-08-31 ENCOUNTER — Telehealth: Payer: Self-pay

## 2017-08-31 NOTE — Telephone Encounter (Signed)
Saxenda PA approved through 08/31/2018.

## 2017-08-31 NOTE — Telephone Encounter (Signed)
Saxenda PA initiated through Cover My Meds.  Awaiting insurance decision.

## 2017-09-19 ENCOUNTER — Ambulatory Visit: Payer: BLUE CROSS/BLUE SHIELD | Admitting: Family Medicine

## 2017-09-19 ENCOUNTER — Encounter: Payer: Self-pay | Admitting: Family Medicine

## 2017-09-19 VITALS — BP 130/78 | HR 64 | Temp 98.0°F | Ht 73.0 in | Wt 224.4 lb

## 2017-09-19 DIAGNOSIS — F410 Panic disorder [episodic paroxysmal anxiety] without agoraphobia: Secondary | ICD-10-CM | POA: Diagnosis not present

## 2017-09-19 DIAGNOSIS — F321 Major depressive disorder, single episode, moderate: Secondary | ICD-10-CM

## 2017-09-19 MED ORDER — BUSPIRONE HCL 7.5 MG PO TABS: 15.0000 mg | ORAL_TABLET | Freq: Two times a day (BID) | ORAL | 1 refills | Status: DC

## 2017-09-19 NOTE — Progress Notes (Signed)
Subjective:  Jim Hatfield is a 47 y.o. male who presents today with a chief complaint of increased anxiety levels.   HPI:  Depression/Anxiety, established problems, worsening Patient history of anxiety disorder with panic attacks.  He has been on several medications in the past including Paxil, Wellbutrin, Effexor, Prozac, Celexa, Zoloft, Lexapro, and BuSpar.  Effexor worked the best of all these, but he had significant side effects to this.  Currently on propranolol which helps prevent palpitations and racing heart rate.  He has also taken Valium which helps a little bit.  Symptoms have significantly worsened over the last few weeks.  Is now very difficult for him to get out in public due to fears over having a panic attack.  He has been overeating as well.  Depression screen PHQ 2/9 09/19/2017  Decreased Interest 1  Down, Depressed, Hopeless 1  PHQ - 2 Score 2  Altered sleeping 2  Tired, decreased energy 1  Change in appetite 3  Feeling bad or failure about yourself  1  Trouble concentrating 3  Moving slowly or fidgety/restless 0  Suicidal thoughts 0  PHQ-9 Score 12  Difficult doing work/chores Somewhat difficult    GAD 7 : Generalized Anxiety Score 09/19/2017  Nervous, Anxious, on Edge 3  Control/stop worrying 3  Worry too much - different things 3  Trouble relaxing 3  Restless 0  Easily annoyed or irritable 0  Afraid - awful might happen 3  Total GAD 7 Score 15  Anxiety Difficulty Somewhat difficult    ROS: No SI or HI.  ROS: Per HPI  PMH: He reports that he has been smoking.  He has been smoking about 0.50 packs per day. He has never used smokeless tobacco. He reports that he drinks alcohol. He reports that he does not use drugs.  Objective:  Physical Exam: BP 130/78 (BP Location: Left Arm, Patient Position: Sitting, Cuff Size: Normal)   Pulse 64   Temp 98 F (36.7 C) (Oral)   Ht 6\' 1"  (1.854 m)   Wt 224 lb 6.4 oz (101.8 kg)   SpO2 97%   BMI 29.61 kg/m   Wt  Readings from Last 3 Encounters:  09/19/17 224 lb 6.4 oz (101.8 kg)  06/20/17 226 lb (102.5 kg)  05/02/17 245 lb 9.6 oz (111.4 kg)  Gen: NAD, resting comfortably Neuro: Grossly normal, moves all extremities Psych: Normal affect and thought content  Assessment/Plan:  Panic disorder Cymbalta Worsened over the last few weeks.  Patient is concerned he may have underlying GI illness.  Discussed with patient that his symptoms including his abdominal sensations and palpitations are very typical of anxiety with panic attacks.  Discussed natural course of anxiety and that patients typically have relapses after long periods of doing well.  Discussed with patient regarding treatment options.  He has tried several antidepressants in the past which have not significantly helped with his symptoms.  Will start BuSpar 7.5 mg twice daily and titrate to 50 mg twice daily over the next couple of weeks.  Discussed possible side effects of medication.  Discussed reasons to return to care.  He will follow-up with me in 4 to 6 weeks.  If no improvement, would consider starting gabapentin, Remeron, or TCA.  May need referral to psychiatry if symptoms are not adequately controlled with above.  Offered referral to behavioral therapy, however patient declined.  Depression, major, single episode, moderate (HCC) See panic disorder A/P.  Patient thinks depressive symptoms are likely reactive to his  disappointment of having a relapse of his anxiety.  Start BuSpar today.  We will follow-up in 6 weeks.  Time Spent: I spent >40 minutes face-to-face with the patient, with more than half spent on counseling for natural history and treatment plan for his depression/anxiety.  Katina Degreealeb M. Jimmey RalphParker, MD 09/19/2017 9:46 AM

## 2017-09-19 NOTE — Assessment & Plan Note (Addendum)
See panic disorder A/P.  Patient thinks depressive symptoms are likely reactive to his disappointment of having a relapse of his anxiety.  Start BuSpar today.  We will follow-up in 6 weeks.

## 2017-09-19 NOTE — Assessment & Plan Note (Signed)
Cymbalta Worsened over the last few weeks.  Patient is concerned he may have underlying GI illness.  Discussed with patient that his symptoms including his abdominal sensations and palpitations are very typical of anxiety with panic attacks.  Discussed natural course of anxiety and that patients typically have relapses after long periods of doing well.  Discussed with patient regarding treatment options.  He has tried several antidepressants in the past which have not significantly helped with his symptoms.  Will start BuSpar 7.5 mg twice daily and titrate to 50 mg twice daily over the next couple of weeks.  Discussed possible side effects of medication.  Discussed reasons to return to care.  He will follow-up with me in 4 to 6 weeks.  If no improvement, would consider starting gabapentin, Remeron, or TCA.  May need referral to psychiatry if symptoms are not adequately controlled with above.  Offered referral to behavioral therapy, however patient declined.

## 2017-09-19 NOTE — Patient Instructions (Signed)
It was very nice to see you today!  I think your symptoms are from anxiety. Please start the buspar. Take 1 pill twice a day for the first week.  You can then increase to 1 pill in the morning and 2 pills in the evening after a week.  If you do well on this for a week, please increase to 2 pills in the morning and 2 pills in the evening.  Come back to see me in 4 to 6 weeks, or sooner as needed.  Take care, Dr Jimmey RalphParker

## 2017-10-03 ENCOUNTER — Encounter: Payer: Self-pay | Admitting: Family Medicine

## 2017-10-03 ENCOUNTER — Ambulatory Visit: Payer: BLUE CROSS/BLUE SHIELD | Admitting: Family Medicine

## 2017-10-03 DIAGNOSIS — F410 Panic disorder [episodic paroxysmal anxiety] without agoraphobia: Secondary | ICD-10-CM

## 2017-10-03 MED ORDER — DIAZEPAM 10 MG PO TABS: 10.0000 mg | ORAL_TABLET | Freq: Two times a day (BID) | ORAL | 1 refills | Status: DC | PRN

## 2017-10-03 MED ORDER — MIRTAZAPINE 15 MG PO TABS: 15.0000 mg | ORAL_TABLET | Freq: Every day | ORAL | 1 refills | Status: DC

## 2017-10-03 NOTE — Assessment & Plan Note (Signed)
Symptoms are significantly worse today.  Unfortunately he did not respond to BuSpar.  He has tried several other medications in the past including Paxil, Wellbutrin, Effexor, Prozac, Celexa, Zoloft, and Lexapro which have not helped.  Given his severe anxiety with agoraphobia, we will restart low-dose benzodiazepine in addition to Remeron 15 mg nightly.  Discussed with patient that he should only use the Valium for the first couple of weeks while his body adjusts to the Remeron.  Discussed potential long-term side effects of Valium including chemical dependence.  Also discussed simple behavioral techniques such as exposure and delayed response.  He is planning on following up with our therapist.  He will follow-up with me in 3 to 4 weeks.

## 2017-10-03 NOTE — Patient Instructions (Addendum)
It was very nice to see you today!  I am sorry the buspar did not work.  Please take remeron every night before bed.  Please take the valium twice daily for the next 2 weeks, then as needed.  Come back to see me in 3-4 weeks, or sooner as needed.  Please try working on the "delayed response" technique we discussed.   Take care, Dr Jimmey RalphParker

## 2017-10-03 NOTE — Progress Notes (Signed)
   Subjective:  Jim Hatfield is a 47 y.o. male who presents today with a chief complaint of anxiety.   HPI:  Anxiety, chronic problem, worsening Patient seen about 2 weeks ago for this.  At that time we started him on BuSpar.  Unfortunately, symptoms have significantly worsened since then.  He stopped BuSpar about 5 days ago as he thought that it was making his symptoms worse.  He has had significant difficulty with feelings of panic and anxiety, mostly related to being on problem.  It is now very difficult for him to get out and go to the store or restaurant.  Symptoms are significantly impacting his daily life.  Additionally, he recently found out that it is possible he could be transferred given location for his job, which is giving him additional stress.  ROS: Per HPI  PMH: He reports that he has been smoking.  He has been smoking about 0.50 packs per day. He has never used smokeless tobacco. He reports that he drinks alcohol. He reports that he does not use drugs.  Objective:  Physical Exam: BP 138/84 (BP Location: Left Arm, Patient Position: Sitting, Cuff Size: Normal)   Pulse 85   Temp 98.5 F (36.9 C) (Oral)   Ht 6\' 1"  (1.854 m)   Wt 224 lb (101.6 kg)   SpO2 97%   BMI 29.55 kg/m   Gen: NAD, resting comfortably CV: RRR with no murmurs appreciated Pulm: NWOB, CTAB with no crackles, wheezes, or rhonchi MSK: No edema, cyanosis, or clubbing noted Skin: Warm, dry Neuro: Grossly normal, moves all extremities Psych: Normal affect and thought content  Assessment/Plan:  Panic disorder Symptoms are significantly worse today.  Unfortunately he did not respond to BuSpar.  He has tried several other medications in the past including Paxil, Wellbutrin, Effexor, Prozac, Celexa, Zoloft, and Lexapro which have not helped.  Given his severe anxiety with agoraphobia, we will restart low-dose benzodiazepine in addition to Remeron 15 mg nightly.  Discussed with patient that he should only use  the Valium for the first couple of weeks while his body adjusts to the Remeron.  Discussed potential long-term side effects of Valium including chemical dependence.  Also discussed simple behavioral techniques such as exposure and delayed response.  He is planning on following up with our therapist.  He will follow-up with me in 3 to 4 weeks.  Time Spent: I spent >25 minutes face-to-face with the patient, with more than half spent on counseling for management plan for his anxiety.  Katina Degreealeb M. Jimmey RalphParker, MD 10/03/2017 11:52 AM

## 2017-10-07 ENCOUNTER — Telehealth: Payer: Self-pay

## 2017-10-07 NOTE — Telephone Encounter (Signed)
Copied from CRM [id number]. Topic: General - Other >> Oct 06, 2017  3:30 PM Trula SladeWalter, Linda F wrote: Reason for CRM:  Patient stated he is not taking his mirtazapine (REMERON) 15 MG tablet medication, and he couldn't get an appt with Misty StanleyLisa , the councelor until 11/23/17.  He wants to know if the provider could get him in sooner.    Message taken for Misty StanleyLisa and given to her to follow up.

## 2017-10-13 DIAGNOSIS — R0789 Other chest pain: Secondary | ICD-10-CM | POA: Diagnosis not present

## 2017-10-13 DIAGNOSIS — R457 State of emotional shock and stress, unspecified: Secondary | ICD-10-CM | POA: Diagnosis not present

## 2017-10-13 DIAGNOSIS — R079 Chest pain, unspecified: Secondary | ICD-10-CM | POA: Diagnosis not present

## 2017-10-14 ENCOUNTER — Ambulatory Visit: Payer: BLUE CROSS/BLUE SHIELD | Admitting: Family Medicine

## 2017-10-14 ENCOUNTER — Encounter: Payer: Self-pay | Admitting: Family Medicine

## 2017-10-14 VITALS — BP 144/86 | HR 86 | Temp 97.9°F | Ht 73.0 in | Wt 224.0 lb

## 2017-10-14 DIAGNOSIS — R002 Palpitations: Secondary | ICD-10-CM | POA: Diagnosis not present

## 2017-10-14 DIAGNOSIS — F410 Panic disorder [episodic paroxysmal anxiety] without agoraphobia: Secondary | ICD-10-CM | POA: Diagnosis not present

## 2017-10-14 NOTE — Patient Instructions (Addendum)
It was very nice to see you today!  Please start the valium 10mg  twice daily for the next couple of weeks.  Please also restart your BuSpar.  Things will get better!  Your EKG today has a few extra beats, these are not of any concern and should not cause you any long-term problems.  Please come back to see me in 2 to 3 weeks, or sooner as needed.  Take care, Dr Jimmey RalphParker

## 2017-10-14 NOTE — Progress Notes (Signed)
Subjective:  Jim Hatfield is a 47 y.o. male who presents today for same-day appointment with a chief complaint of irregular heart rhythm and anxiety.   HPI:  Irregular Heart Rhythm, Acute problem  Patient had an episode yesterday where he had an unusual sensation in the bottom of his chest and top of his abdomen.  He became very concerned and called EMS.  EMS came to the scene and evaluated patient via EKG. Patient was having premature beats but no other abnormalities.  Patient was recommended to go to the emergency department, however he declined.  Symptoms have been relatively stable over the past day.  He still feels an occasional palpitation in his chest.  No associated chest pain, shortness of breath, dizziness, or loss of consciousness.  He is not sure of any obvious precipitating events.  He thinks that it may be due to something that he had just eaten.  Anxiety/panic disorder, chronic problem, uncontrolled Patient seen a little over a week ago for this.  At that time we had decided to restart his Valium as well as restart Remeron as he had had no success with several other medications.  Patient went home and look up possible side effects to medications including BuSpar and became very fearful about starting any additional medications.  He has not yet started the Remeron.  Patient thinks that his symptoms were improving a little bit until his episode yesterday.  Since then he has had severe anxiety and panicky feelings.  Reports that his mother looked up Remeron online and told him that it could worsen panic attacks and he is very fearful of taking it.  He is only been taking half of a tablet of Valium as needed.  ROS: Per HPI  PMH: He reports that he has been smoking.  He has been smoking about 0.50 packs per day. He has never used smokeless tobacco. He reports that he drinks alcohol. He reports that he does not use drugs.  Objective:  Physical Exam: BP (!) 144/86 (BP Location: Left  Arm, Patient Position: Sitting, Cuff Size: Normal)   Pulse 86   Temp 97.9 F (36.6 C) (Oral)   Ht 6\' 1"  (1.854 m)   Wt 224 lb (101.6 kg)   SpO2 98%   BMI 29.55 kg/m   Gen: NAD, resting comfortably, intermittently tearful CV: RRR with no murmurs appreciated Pulm: NWOB, CTAB with no crackles, wheezes, or rhonchi Psych: Anxious affect.  Normal mood.  Intermittently tearful.  Poor eye contact.  EKG: Normal sinus rhythm.  3 PACs.  No ischemic changes.  Assessment/Plan:  Abnormal heart rhythm/PACs Patient's EKG today is within normal limits.  His cardiac strip from EMS yesterday also reveals a few PACs.  No signs of SVT or other significant arrhythmias.  Reassured patient.  Do not need further work-up at this time given benign nature of his EKG and lack of other concerning symptoms.  We will continue with watchful waiting.  Panic disorder Symptoms have continued to be very troublesome and uncontrolled for patient.  Unfortunately, he has not taken medications as prescribed due to fear/anxiety over side effects.  Had a very long conversation with patient about weighing benefits of medication versus risk of side effects.  He is willing to restart another daily medication at this point.  Given his severe degree of anxiety over starting new medications, I instructed patient to take Valium 10 mg twice daily for the next 1 to 2 weeks until the other medication becomes effective.  Had a lengthy discussion regarding which of the medication we would try.  States that he has tried Zoloft in the past which worked well however unfortunately had significant sexual side effects of this.  He is interested in starting back on the BuSpar.  He will start back at 7.5 mg twice daily in addition to taking the Valium as directed above.  He will come back to see me in 2 to 3 weeks.  Hopefully we can increase the dose of BuSpar as tolerated.  Time Spent: I spent >40 minutes face-to-face with the patient, with more than  half spent on counseling for his PACs and panic disorder.   Katina Degree. [name], MD 10/14/2017 12:02 PM

## 2017-10-14 NOTE — Assessment & Plan Note (Addendum)
Symptoms have continued to be very troublesome and uncontrolled for patient.  Unfortunately, he has not taken medications as prescribed due to fear/anxiety over side effects.  Had a very long conversation with patient about weighing benefits of medication versus risk of side effects.  He is willing to restart another daily medication at this point.  Given his severe degree of anxiety over starting new medications, I instructed patient to take Valium 10 mg twice daily for the next 1 to 2 weeks until the other medication becomes effective.  Had a lengthy discussion regarding which of the medication we would try.  States that he has tried Zoloft in the past which worked well however unfortunately had significant sexual side effects of this.  He is interested in starting back on the BuSpar.  He will start back at 7.5 mg twice daily in addition to taking the Valium as directed above.  He will come back to see me in 2 to 3 weeks.  Hopefully we can increase the dose of BuSpar as tolerated.

## 2017-10-19 ENCOUNTER — Encounter: Payer: Self-pay | Admitting: Physician Assistant

## 2017-10-19 ENCOUNTER — Ambulatory Visit: Payer: Self-pay

## 2017-10-19 ENCOUNTER — Ambulatory Visit: Payer: BLUE CROSS/BLUE SHIELD | Admitting: Physician Assistant

## 2017-10-19 VITALS — BP 150/100 | HR 66 | Temp 97.6°F | Ht 73.0 in | Wt 213.4 lb

## 2017-10-19 DIAGNOSIS — F410 Panic disorder [episodic paroxysmal anxiety] without agoraphobia: Secondary | ICD-10-CM

## 2017-10-19 DIAGNOSIS — R002 Palpitations: Secondary | ICD-10-CM | POA: Diagnosis not present

## 2017-10-19 NOTE — Telephone Encounter (Signed)
Pt. Called to report onset of irregular heart beat about 20 minutes prior to phone call.  Reported he had an episode similar to this, last Thurs., and called EMS.  Was then seen in the office by PCP on Friday.  Per pt., he had an EKG at that time; was informed there was some irreg. beats, but nothing to be concerned about.    Today, described a "discomfort in the sternum that feels like a spasm, and below the sternum, that just feels strange."  Pt. checked pulse during call; reported pulse 56 and irreg. Denied shortness of breath, dizziness, nausea or sweating @ this time.  Reported the main difference between last Thursday's episode and today's episode, is that he did not feel any anxiety, prior to the onset of his symptoms today, and has taken his anxiety medication today, as scheduled.  The pt. Stated "I just need to know if what I'm feeling right now, is anything to be concerned about."  Pt. Requesting to se seen right now so that his irregular heart beat can be captured on the EKG.  Called [facility]; spoke with [place]; advised of pt's symptoms and request to be evaluated today.  rec'd verbal okay to put pt. On PA schedule at 2:20 PM, as Dr. [name] is out of the office.    Pt. Given the appt. @ 2:20 with PA; voiced concern that the appt. Is an hour from now, and may not still have the irreg. heart rhythm at that time.  Tried to reassure pt. that this is the only option for evaluation at this time; encouraged to keep the appt.  Agreed with plan.        Reason for Disposition . [1] Palpitations AND [2] no improvement after using CARE ADVICE  Answer Assessment - Initial Assessment Questions 1. DESCRIPTION: "Please describe your heart rate or heart beat that you are having" (e.g., fast/slow, regular/irregular, skipped or extra beats, "palpitations")    "Feels like right below the sternum, there is a strange feeling, and feels an irreg. beat  2. ONSET: "When did it start?" (Minutes, hours or days)      20  minutes ago 3. DURATION: "How long does it last" (e.g., seconds, minutes, hours)     Ongoing  4. PATTERN "Does it come and go, or has it been constant since it started?"  "Does it get worse with exertion?"   "Are you feeling it now?"     Feels this continuous 5. TAP: "Using your hand, can you tap out what you are feeling on a chair or table in front of you, so that I can hear?" (Note: not all patients can do this)       No asked  6. HEART RATE: "Can you tell me your heart rate?" "How many beats in 15 seconds?"  (Note: not all patients can do this)       Pulse 56 and irreg.  7. RECURRENT SYMPTOM: "Have you ever had this before?" If so, ask: "When was the last time?" and "What happened that time?"     Episode last Thurs. With similar sx's and he became anxious; call EMS at that time.  8. CAUSE: "What do you think is causing the palpitations?"     Unknown; this is different than Thurs. episode, because I haven't felt anxious this morning. 9. CARDIAC HISTORY: "Do you have any history of heart disease?" (e.g., heart attack, angina, bypass surgery, angioplasty, arrhythmia)      Denied heart disease; cleared  by Cardiology; has occas. PVC's  10. OTHER SYMPTOMS: "Do you have any other symptoms?" (e.g., dizziness, chest pain, sweating, difficulty breathing)       Feels some pressure at the sternum, like a spasm; denied shortness of breath, nausea, or sweating.  Protocols used: HEART RATE AND HEARTBEAT QUESTIONS-A-AH

## 2017-10-19 NOTE — Progress Notes (Signed)
Jim Hatfield is a 47 y.o. male here for a follow up of a pre-existing problem.  I acted as a Neurosurgeon for [employer], PA-C Corky Mull, LPN  History of Present Illness:   Chief Complaint  Patient presents with  . "Irregular heartbeat"    HPI   Patient was seen in our office by PCP on Friday, August 2nd for palpitations and panic disorder. EKG at that visit showed PACs. He was instructed to start taking Valium on a scheduled basis as well as BuSpar. He reports that he has been compliant with this regimen.   Earlier this afternoon he was sitting on his couch and felt another episode of "irregular heartbeats." He states that he wasn't doing anything to precipitate this. He denies: caffeine use, alcohol use, stimulant or illegal drug use. He continues to smoke 1 PPD, states that he wants to quit but doesn't think that this is the best time for him to do so.  He is extremely tearful. Denies chest pain, SOB, or any changes in symptoms that he had on Friday. He recalls his history of an episode of SVT dx 12 years ago and repeats several times during our encounter that he fears that he is experiencing this again. He was seen by Dr. Jacinto Halim for several years after this and was "cleared" with all symptoms reportedly being attributed to anxiety.  Denies concerns for sleep apnea -- snoring, gasping for air, multiple awakenings in the middle of the night, or excessive daytime somnolence.  Most recent labs were completed almost 1 year ago, all but cholesterol were normal.   He also reports loneliness. States that he doesn't have anyone to talk to about his symptoms other than his mother. Has depression associated with his anxiety, denies SI/HI. He mentions he has an upcoming appointment with our therapist, Colen Darling in mid-September.  GAD 7 : Generalized Anxiety Score 10/19/2017 09/19/2017 10/22/2016  Nervous, Anxious, on Edge 3 3 3   Control/stop worrying 3 3 3   Worry too much - different things 0  3 3  Trouble relaxing 3 3 3   Restless 1 0 0  Easily annoyed or irritable 0 0 0  Afraid - awful might happen 3 3 3   Total GAD 7 Score 13 15 15   Anxiety Difficulty Somewhat difficult Somewhat difficult Somewhat difficult      Wt Readings from Last 15 Encounters:  10/19/17 213 lb 6.1 oz (96.8 kg)  10/14/17 224 lb (101.6 kg)  10/03/17 224 lb (101.6 kg)  09/19/17 224 lb 6.4 oz (101.8 kg)  06/20/17 226 lb (102.5 kg)  05/02/17 245 lb 9.6 oz (111.4 kg)  03/29/17 253 lb 9.6 oz (115 kg)  10/22/16 243 lb 6.4 oz (110.4 kg)  04/03/15 225 lb (102.1 kg)     Past Medical History:  Diagnosis Date  . Anxiety   . Cardiac arrhythmia due to congenital heart disease   . Depression   . Diverticulitis   . Diverticulitis   . SVT (supraventricular tachycardia) (HCC)      Social History   Socioeconomic History  . Marital status: Divorced    Spouse name: Not on file  . Number of children: Not on file  . Years of education: Not on file  . Highest education level: Not on file  Occupational History  . Not on file  Social Needs  . Financial resource strain: Not on file  . Food insecurity:    Worry: Not on file    Inability: Not on file  .  Transportation needs:    Medical: Not on file    Non-medical: Not on file  Tobacco Use  . Smoking status: Current Every Day Smoker    Packs/day: 0.50  . Smokeless tobacco: Never Used  Substance and Sexual Activity  . Alcohol use: Yes    Comment: socially  . Drug use: No  . Sexual activity: Yes  Lifestyle  . Physical activity:    Days per week: Not on file    Minutes per session: Not on file  . Stress: Not on file  Relationships  . Social connections:    Talks on phone: Not on file    Gets together: Not on file    Attends religious service: Not on file    Active member of club or organization: Not on file    Attends meetings of clubs or organizations: Not on file    Relationship status: Not on file  . Intimate partner violence:    Fear of  current or ex partner: Not on file    Emotionally abused: Not on file    Physically abused: Not on file    Forced sexual activity: Not on file  Other Topics Concern  . Not on file  Social History Narrative  . Not on file    Past Surgical History:  Procedure Laterality Date  . FOOT SURGERY Left     Family History  Problem Relation Age of Onset  . Cancer Mother        Breast  . Hypertension Mother   . Cancer Father 22       Pancreatic   . Heart attack Father   . Hyperlipidemia Father   . Hypertension Father   . Heart disease Father   . Depression Sister   . Depression Brother     Allergies  Allergen Reactions  . Ativan [Lorazepam] Other (See Comments)    Jittery    Current Medications:   Current Outpatient Medications:  .  busPIRone (BUSPAR) 7.5 MG tablet, Take 7.5 mg by mouth 2 (two) times daily., Disp: , Rfl:  .  diazepam (VALIUM) 10 MG tablet, Take 1 tablet (10 mg total) by mouth every 12 (twelve) hours as needed for anxiety., Disp: 60 tablet, Rfl: 1 .  Insulin Pen Needle (B-D UF III MINI PEN NEEDLES) 31G X 5 MM MISC, USE DAILY AS NEEDED, Disp: 100 each, Rfl: 0 .  Liraglutide -Weight Management (SAXENDA) 18 MG/3ML SOPN, Inject 3 mg into the skin daily., Disp: 5 pen, Rfl: 11 .  meloxicam (MOBIC) 15 MG tablet, Take 1 tablet (15 mg total) by mouth daily., Disp: 30 tablet, Rfl: 0 .  propranolol ER (INDERAL LA) 120 MG 24 hr capsule, Take 1 capsule (120 mg total) by mouth daily. Takes once per day., Disp: 90 capsule, Rfl: 3 .  mirtazapine (REMERON) 15 MG tablet, Take 1 tablet (15 mg total) by mouth at bedtime. (Patient not taking: Reported on 10/14/2017), Disp: 30 tablet, Rfl: 1   Review of Systems:   ROS  Negative unless otherwise specified per HPI.  Vitals:   Vitals:   10/19/17 1408  BP: (!) 150/100  Pulse: 66  Temp: 97.6 F (36.4 C)  TempSrc: Oral  SpO2: 98%  Weight: 213 lb 6.1 oz (96.8 kg)  Height: 6\' 1"  (1.854 m)     Body mass index is 28.15  kg/m.  Physical Exam:   Physical Exam  Constitutional: He appears well-developed. He is cooperative.  Non-toxic appearance. He does not have a sickly  appearance. He does not appear ill. No distress.  Cardiovascular: Normal rate, regular rhythm, S1 normal, S2 normal, normal heart sounds and normal pulses.  No LE edema  Pulmonary/Chest: Effort normal and breath sounds normal.  Neurological: He is alert. GCS eye subscore is 4. GCS verbal subscore is 5. GCS motor subscore is 6.  Skin: Skin is warm, dry and intact.  Psychiatric: His speech is normal and behavior is normal. His mood appears anxious. Cognition and memory are normal.  Tearful throughout encounter; checking pulse constantly throughout encounter  Nursing note and vitals reviewed.  EKG tracing is personally reviewed.  EKG notes NSR.  No acute changes.   Assessment and Plan:    Carollee HerterShannon was seen today for palpitations and panic disorder.  Diagnoses and all orders for this visit:  Palpitations EKG tracing is personally reviewed.  EKG notes NSR.  No acute changes. No red flags on exam. When I discussed this with the patient, he was tearful. Offered second opinion with cardiology with repeat Holter monitor (if indicated) if it would provide reassurance however patient declined. Offered updating labs and he was agreeable. Continue current propranolol dosage. Has scheduled appointment with PCP on 10/25/17.  -     EKG 12-Lead -     TSH -     CBC -     Comprehensive metabolic panel  Panic disorder Colen DarlingLisa Flores in office to see patient, provided reassurance and cell phone number if needed. She is going to try to work him into her schedule ASAP to begin scheduled therapy session -- appreciate coordination of care. Continue scheduled Valium and BuSpar. Denies SI/HI at visit, I discussed with patient that if they develop any SI, to tell someone immediately and seek medical attention. Patient verbalized understanding to plan. Has scheduled  appointment with PCP on 10/25/17.  . Reviewed expectations re: course of current medical issues. . Discussed self-management of symptoms. . Outlined signs and symptoms indicating need for more acute intervention. . Patient verbalized understanding and all questions were answered. . See orders for this visit as documented in the electronic medical record. . Patient received an After-Visit Summary.  CMA or LPN served as scribe during this visit. History, Physical, and Plan performed by medical provider. Documentation and orders reviewed and attested to.  I spent 40 minutes with this patient, greater than 50% was face-to-face time counseling regarding the above diagnoses.  Jarold MottoSamantha Beverley Sherrard, PA-C

## 2017-10-19 NOTE — Patient Instructions (Signed)
It was great to see you!  We will contact you regarding your lab results.  Jim StanleyLisa will be in touch about seeing you soon.  If you have any changes in symptoms please notify someone immediately.

## 2017-10-20 LAB — CBC
HEMATOCRIT: 47.7 % (ref 39.0–52.0)
HEMOGLOBIN: 16.1 g/dL (ref 13.0–17.0)
MCHC: 33.8 g/dL (ref 30.0–36.0)
MCV: 92.3 fl (ref 78.0–100.0)
Platelets: 204 10*3/uL (ref 150.0–400.0)
RBC: 5.16 Mil/uL (ref 4.22–5.81)
RDW: 13 % (ref 11.5–15.5)
WBC: 10.4 10*3/uL (ref 4.0–10.5)

## 2017-10-20 LAB — COMPREHENSIVE METABOLIC PANEL
ALBUMIN: 4.2 g/dL (ref 3.5–5.2)
ALK PHOS: 74 U/L (ref 39–117)
ALT: 25 U/L (ref 0–53)
AST: 11 U/L (ref 0–37)
BUN: 8 mg/dL (ref 6–23)
CALCIUM: 9.8 mg/dL (ref 8.4–10.5)
CO2: 29 mEq/L (ref 19–32)
Chloride: 103 mEq/L (ref 96–112)
Creatinine, Ser: 1.09 mg/dL (ref 0.40–1.50)
GFR: 76.36 mL/min (ref >60.00)
Glucose, Bld: 86 mg/dL (ref 70–99)
POTASSIUM: 4.2 meq/L (ref 3.5–5.1)
Sodium: 140 mEq/L (ref 135–145)
TOTAL PROTEIN: 6.7 g/dL (ref 6.0–8.3)
Total Bilirubin: 0.5 mg/dL (ref 0.2–1.2)

## 2017-10-20 LAB — TSH: TSH: 0.99 u[IU]/mL (ref 0.35–4.50)

## 2017-10-21 ENCOUNTER — Ambulatory Visit (INDEPENDENT_AMBULATORY_CARE_PROVIDER_SITE_OTHER): Payer: BLUE CROSS/BLUE SHIELD | Admitting: Psychology

## 2017-10-21 DIAGNOSIS — F410 Panic disorder [episodic paroxysmal anxiety] without agoraphobia: Secondary | ICD-10-CM

## 2017-10-24 ENCOUNTER — Ambulatory Visit (INDEPENDENT_AMBULATORY_CARE_PROVIDER_SITE_OTHER): Payer: BLUE CROSS/BLUE SHIELD | Admitting: Psychology

## 2017-10-24 DIAGNOSIS — F410 Panic disorder [episodic paroxysmal anxiety] without agoraphobia: Secondary | ICD-10-CM

## 2017-10-25 ENCOUNTER — Encounter: Payer: Self-pay | Admitting: Family Medicine

## 2017-10-25 ENCOUNTER — Ambulatory Visit: Payer: BLUE CROSS/BLUE SHIELD | Admitting: Family Medicine

## 2017-10-25 VITALS — BP 126/84 | HR 78 | Temp 97.8°F | Ht 73.0 in | Wt 213.0 lb

## 2017-10-25 DIAGNOSIS — F410 Panic disorder [episodic paroxysmal anxiety] without agoraphobia: Secondary | ICD-10-CM | POA: Diagnosis not present

## 2017-10-25 DIAGNOSIS — R1013 Epigastric pain: Secondary | ICD-10-CM

## 2017-10-25 LAB — LIPASE: LIPASE: 39 U/L (ref 11.0–59.0)

## 2017-10-25 LAB — H. PYLORI ANTIBODY, IGG: H PYLORI IGG: NEGATIVE

## 2017-10-25 MED ORDER — BUSPIRONE HCL 15 MG PO TABS: 15.0000 mg | ORAL_TABLET | Freq: Two times a day (BID) | ORAL | 1 refills | Status: DC

## 2017-10-25 NOTE — Assessment & Plan Note (Addendum)
Symptoms seem to be modestly improving today.  Had lengthy discussion with patient regarding his range of symptoms and treatment plan.  Given that symptoms seem to be improving, we will continue his Valium 10 mg twice daily and BuSpar 15 mg twice daily.  He will also continue seeing our behavioral specialist.  He will follow-up with me in 3 to 4 weeks.  If continues to have severe anxiety type symptoms, would consider addition of SSRI.  May need referral to psychiatry continues to have difficult to control symptoms, however hopeful that he will continue to have improvement with medications and psychotherapy.  Reassured patient that his cardiac work-up has been negative.

## 2017-10-25 NOTE — Assessment & Plan Note (Addendum)
No red flag signs or symptoms.  Description seems to be most consistent with indigestion.  Patient is very hesitant to make medication changes at this point due to his underlying anxiety.  We will check lipase and H. pylori today to rule out other causes.  Recently had CMET which was within normal limits.  Recommended patient try over-the-counter Tums next time he has a recurrence of symptoms to see if this improves his symptoms. If he has improvement, would consider trial of PPI or H2 blocker.  Consider abdominal ultrasound if symptoms persist and above work-up is negative.  May ultimately need referral to GI.

## 2017-10-25 NOTE — Progress Notes (Signed)
Subjective:  Jim Hatfield is a 47 y.o. male who presents today with a chief complaint of anxiety follow up.   HPI:  Anxiety/Panic Disorder, chronic problem Patient seen multiple times over the past several weeks for this. Since our last visit 11 days ago, patient has met with behavioral therapy twice and had an additional office visit for palpitations. During our last office visit, patient was instructed to take scheduled valium 15m every 12 hours and recommended to restart his buspar at 7.565mtwice daily. Symptoms had modestly improved since our last visit. He has worked his way up to buspar [id number] daily which he just started yesterday. He is meeting with LisLattie Hawich he thinks is going well.   Abdominal Pain, new problem to provider Patient was several months to year history of occasional epigastric abdominal pain after eating.  Previously attributes this mostly to his anxiety and possible medication side effects.  Symptoms have been stable over the last several months.  No other obvious triggers.  No specific treatments tried.  He has had some weight loss recently, but this is attributed to his Saxenda.  He has had occasional constipation over the last 2 weeks.  Also some nausea recently that he attributes to the BuSLimestoneOccasionally feels a spasm-like sensation in his epigastric area.  ROS: Per HPI  PMH: He reports that he has been smoking. He has been smoking about 0.50 packs per day. He has never used smokeless tobacco. He reports that he drinks alcohol. He reports that he does not use drugs.  Objective:  Physical Exam: BP 126/84 (BP Location: Left Arm, Patient Position: Sitting, Cuff Size: Normal)   Pulse 78   Temp 97.8 F (36.6 C) (Oral)   Ht 6' 1"  (1.854 m)   Wt 213 lb (96.6 kg)   SpO2 97%   BMI 28.10 kg/m   Wt Readings from Last 3 Encounters:  10/25/17 213 lb (96.6 kg)  10/19/17 213 lb 6.1 oz (96.8 kg)  10/14/17 224 lb (101.6 kg)  Gen: NAD, resting comfortably CV:  RRR with no murmurs appreciated Pulm: NWOB, CTAB with no crackles, wheezes, or rhonchi GI: Normal bowel sounds present. Soft, Nontender, Nondistended.  Assessment/Plan:  Panic disorder Symptoms seem to be modestly improving today.  Had lengthy discussion with patient regarding his range of symptoms and treatment plan.  Given that symptoms seem to be improving, we will continue his Valium 10 mg twice daily and BuSpar 15 mg twice daily.  He will also continue seeing our behavioral specialist.  He will follow-up with me in 3 to 4 weeks.  If continues to have severe anxiety type symptoms, would consider addition of SSRI.  May need referral to psychiatry continues to have difficult to control symptoms, however hopeful that he will continue to have improvement with medications and psychotherapy.  Reassured patient that his cardiac work-up has been negative.  Epigastric abdominal pain No red flag signs or symptoms.  Description seems to be most consistent with indigestion.  Patient is very hesitant to make medication changes at this point due to his underlying anxiety.  We will check lipase and H. pylori today to rule out other causes.  Recently had CMET which was within normal limits.  Recommended patient try over-the-counter Tums next time he has a recurrence of symptoms to see if this improves his symptoms. If he has improvement, would consider trial of PPI or H2 blocker.  Consider abdominal ultrasound if symptoms persist and above work-up is negative.  May ultimately need referral to GI.  Time Spent: I spent >40 minutes face-to-face with the patient, with more than half spent on counseling for treatment plan for his abdominal pain and panic disorder.   Algis Greenhouse. Jerline Pain, MD 10/25/2017 10:38 AM

## 2017-10-25 NOTE — Patient Instructions (Signed)
It was very nice to see you today!  No medication changes today.  Try taking Tums when he had a sensation in your abdomen.  Your symptoms are not coming from your heart.  Please continue current doses of Valium and BuSpar.  Please see me again in 3 to 4 weeks.  Come back to see me sooner if anything else comes up,  Take care, Dr Jimmey RalphParker

## 2017-10-26 NOTE — Progress Notes (Signed)
Dr Lavone NeriParker's interpretation of your lab work:  Your blood work is NORMAL. Your pancreas levels are normal and your test for H pylori is negative. Please try taking TUMS for your abdominal discomfort as we discussed. I will see you back for your office visit in a few weeks, or sooner as needed.    If you have any additional questions, please give us a call or send us a message through [email or web address].  Take care, Dr Jimmey RalphParker

## 2017-10-27 ENCOUNTER — Telehealth: Payer: Self-pay | Admitting: Family Medicine

## 2017-10-27 NOTE — Telephone Encounter (Signed)
Patient walked into clinical complaining of palpitations. He states that he has been very anxious but he is worried about his heart. Patient also states he has been having some epigastric pain.  BP: 150/11 HR: 89 RR:16 02:98% I reviewed triage with Dr Jimmey RalphParker who gave orders for patient to take extra dose of Valium if needed at least 4-6 hrs apart from his last dose and follow up tomorrow if no relief. Patient states his last dose was at 3:00. I explained that he can take another at 7:00 at the earliest. If he does not experience any relief he can call tomorrow to make an appointment. I also explained that if he starts feeling worse he can go to the Emergency Department. Patient stated understanding.

## 2017-10-28 ENCOUNTER — Encounter: Payer: Self-pay | Admitting: Family Medicine

## 2017-10-28 ENCOUNTER — Ambulatory Visit: Payer: BLUE CROSS/BLUE SHIELD | Admitting: Family Medicine

## 2017-10-28 DIAGNOSIS — F410 Panic disorder [episodic paroxysmal anxiety] without agoraphobia: Secondary | ICD-10-CM

## 2017-10-28 NOTE — Assessment & Plan Note (Signed)
Unfortunately, patient's symptoms seem to have worsened over the last day.  He has extreme anxiety over his epigastric sensation and palpitations.  Had a lengthy discussion with patient regarding his work-up for his palpitations including multiple EKGs and cardiology evaluation without any serious or major medical problems identified.  Patient voiced understanding.  Also spent a significant amount of time discussing his concern over his blood pressure readings.  Discussed with him that it is normal for blood pressure to fluctuate and that blood pressure readings are typically not precise enough for a 1-2 point difference to be significant. He again voiced understanding. Discussed importance of recognizing symptoms and reminding himself that palpitations are not indicative of any major medical issue.  We discussed treatment options. He will continue taking buspar 15mg  twice daily and vallium twice daily. Would consider trial of SSRI.  May eventually need psychiatry referral.

## 2017-10-28 NOTE — Patient Instructions (Signed)
It was very nice to see you today!  We will give the buspar a few more weeks.  Your PACs will not cause any harm to you. They are benign.  Please come back to see Misty StanleyLisa in a  Week and me in 11 days.  Please let me know if you have severe, crushing chest pain or severe shortness of breath.  Take care, Dr Jimmey RalphParker

## 2017-10-28 NOTE — Progress Notes (Signed)
   Subjective:  Jim Hatfield is a 47 y.o. male who presents today with a chief complaint of palpitations.   HPI:  Palpitations/Anxiety, chronic problem Patient with numerous office visits for last few weeks for the same complaint.  Symptoms seem to have sniffily worsen yesterday.  Describes having a "bubble" at the top of his abdomen and feeling several skipped beats afterwards.  Patient is very concerned that his blood pressure is 150/100 exactly whenever he has symptoms.  He has tried taking Tums which did not help.  He is still taking his Valium and BuSpar.  Yesterday he took an additional dose of Valium due to severe anxiety and fell asleep about an hour afterwards.  ROS: Per HPI  PMH: He reports that he has been smoking. He has been smoking about 0.50 packs per day. He has never used smokeless tobacco. He reports that he drinks alcohol. He reports that he does not use drugs.  Objective:  Physical Exam: BP 132/80   Pulse 85   Temp 98.8 F (37.1 C) (Oral)   Ht 6\' 1"  (1.854 m)   Wt 213 lb (96.6 kg)   SpO2 96%   BMI 28.10 kg/m   Gen: NAD, resting comfortably CV: RRR with no murmurs appreciated Pulm: NWOB, CTAB with no crackles, wheezes, or rhonchi  Assessment/Plan:  Panic disorder Unfortunately, patient's symptoms seem to have worsened over the last day.  He has extreme anxiety over his epigastric sensation and palpitations.  Had a lengthy discussion with patient regarding his work-up for his palpitations including multiple EKGs and cardiology evaluation without any serious or major medical problems identified.  Patient voiced understanding.  Also spent a significant amount of time discussing his concern over his blood pressure readings.  Discussed with him that it is normal for blood pressure to fluctuate and that blood pressure readings are typically not precise enough for a 1-2 point difference to be significant. He again voiced understanding. Discussed importance of recognizing  symptoms and reminding himself that palpitations are not indicative of any major medical issue.  We discussed treatment options. He will continue taking buspar 15mg  twice daily and vallium twice daily. Would consider trial of SSRI.  May eventually need psychiatry referral.  Time Spent: I spent >40 minutes face-to-face with the patient, with more than half spent on counseling for treatment plan for his panic disorder.   Katina Degreealeb M. Jimmey RalphParker, MD 10/28/2017 12:10 PM

## 2017-10-29 ENCOUNTER — Other Ambulatory Visit: Payer: Self-pay | Admitting: Family Medicine

## 2017-10-31 ENCOUNTER — Other Ambulatory Visit: Payer: Self-pay

## 2017-10-31 MED ORDER — INSULIN PEN NEEDLE 31G X 5 MM MISC: 11 refills | Status: DC

## 2017-11-03 DIAGNOSIS — R002 Palpitations: Secondary | ICD-10-CM | POA: Diagnosis not present

## 2017-11-03 DIAGNOSIS — I493 Ventricular premature depolarization: Secondary | ICD-10-CM | POA: Diagnosis not present

## 2017-11-03 DIAGNOSIS — I471 Supraventricular tachycardia: Secondary | ICD-10-CM | POA: Diagnosis not present

## 2017-11-03 DIAGNOSIS — I77810 Thoracic aortic ectasia: Secondary | ICD-10-CM | POA: Diagnosis not present

## 2017-11-04 ENCOUNTER — Ambulatory Visit: Payer: BLUE CROSS/BLUE SHIELD | Admitting: Psychology

## 2017-11-04 DIAGNOSIS — F410 Panic disorder [episodic paroxysmal anxiety] without agoraphobia: Secondary | ICD-10-CM

## 2017-11-08 ENCOUNTER — Ambulatory Visit: Payer: BLUE CROSS/BLUE SHIELD | Admitting: Family Medicine

## 2017-11-08 ENCOUNTER — Encounter: Payer: Self-pay | Admitting: Family Medicine

## 2017-11-08 DIAGNOSIS — F410 Panic disorder [episodic paroxysmal anxiety] without agoraphobia: Secondary | ICD-10-CM

## 2017-11-08 DIAGNOSIS — I77810 Thoracic aortic ectasia: Secondary | ICD-10-CM | POA: Diagnosis not present

## 2017-11-08 MED ORDER — DIAZEPAM 10 MG PO TABS: 10.0000 mg | ORAL_TABLET | Freq: Three times a day (TID) | ORAL | 1 refills | Status: DC | PRN

## 2017-11-08 MED ORDER — ESCITALOPRAM OXALATE 10 MG PO TABS: 20.0000 mg | ORAL_TABLET | Freq: Every day | ORAL | 5 refills | Status: DC

## 2017-11-08 NOTE — Assessment & Plan Note (Signed)
Symptoms seem to be modestly improved today.  Had a very lengthy discussion with patient regarding treatment plan for his anxiety and panic disorder.  Reassured patient that his cardiologist did not find any significant abnormalities with his heart.  We will stop BuSpar as it has not worked for him and he is having a few adverse reactions.  Discussed alternatives with patient.  We will start Lexapro 10 mg daily.  He will increase to 20 mg daily if he tolerates well without side effects in the next 1 to 2 weeks.  Discussed potential side effects of medication.  He will also continue taking Valium 10 mg every 8 hours as needed.  He will follow-up with me in 2 to 3 weeks.  He will continue seeing his therapist.

## 2017-11-08 NOTE — Progress Notes (Signed)
   Subjective:  Jim Hatfield is a 47 y.o. male who presents today with a chief complaint of panic disorder.   HPI:  Anxiety, chronic problem Patient last seen 11 days ago for this. Since then, symptoms have modestly improved. He has been compliant with buspar 15mg  bid and valium 10mg  bid. He does not think the buspar is working. Valium helps with his symptoms, though tends to wear off after about 6-8 hours. He recently saw his cardiologist due to concern over his recurrent palpitations.  His cardiologist told patient that his sensations were due to PACs and PVCs were nothing to worry about.  Will be undergoing echocardiogram later today which the patient is anxious about.  Patient feels "weird" after taking his second dose of BuSpar in the evening.  Occasionally has electric-like sensations.  He would like to switch off this medication if possible.  He has tried several other antidepressants in the past without significant improvement.  ROS: Per HPI  PMH: He reports that he has been smoking. He has been smoking about 0.50 packs per day. He has never used smokeless tobacco. He reports that he drinks alcohol. He reports that he does not use drugs.  Objective:  Physical Exam: BP 132/82 (BP Location: Left Arm, Patient Position: Sitting, Cuff Size: Normal)   Pulse 77   Temp 98.3 F (36.8 C) (Oral)   Ht 6\' 1"  (1.854 m)   Wt 207 lb (93.9 kg)   SpO2 96%   BMI 27.31 kg/m   Gen: NAD, resting comfortably Neuro: Grossly normal, moves all extremities Psych: Normal affect and thought content  Assessment/Plan:  Panic disorder Symptoms seem to be modestly improved today.  Had a very lengthy discussion with patient regarding treatment plan for his anxiety and panic disorder.  Reassured patient that his cardiologist did not find any significant abnormalities with his heart.  We will stop BuSpar as it has not worked for him and he is having a few adverse reactions.  Discussed alternatives with patient.   We will start Lexapro 10 mg daily.  He will increase to 20 mg daily if he tolerates well without side effects in the next 1 to 2 weeks.  Discussed potential side effects of medication.  He will also continue taking Valium 10 mg every 8 hours as needed.  He will follow-up with me in 2 to 3 weeks.  He will continue seeing his therapist.  Time Spent: I spent 40 minutes face-to-face with the patient, with more than half spent on counseling for management plan for his panic disorder.  Katina Degreealeb M. Jimmey RalphParker, MD 11/08/2017 10:52 AM

## 2017-11-08 NOTE — Patient Instructions (Signed)
It was very nice to see you today!  Please stop the buspar. Start the lexapro 1 pill daily for the next 2 weeks, then increase to 2 pills daily. You can take valium 3 times daily as needed.  Come back to see me in 2-3 weeks.  Take care, Dr Jimmey RalphParker

## 2017-11-10 ENCOUNTER — Ambulatory Visit: Payer: BLUE CROSS/BLUE SHIELD | Admitting: Psychology

## 2017-11-10 DIAGNOSIS — F410 Panic disorder [episodic paroxysmal anxiety] without agoraphobia: Secondary | ICD-10-CM

## 2017-11-15 ENCOUNTER — Ambulatory Visit: Payer: BLUE CROSS/BLUE SHIELD | Admitting: Psychology

## 2017-11-15 DIAGNOSIS — F410 Panic disorder [episodic paroxysmal anxiety] without agoraphobia: Secondary | ICD-10-CM | POA: Diagnosis not present

## 2017-11-16 ENCOUNTER — Ambulatory Visit: Payer: BLUE CROSS/BLUE SHIELD | Admitting: Family Medicine

## 2017-11-16 ENCOUNTER — Encounter: Payer: Self-pay | Admitting: Family Medicine

## 2017-11-16 VITALS — BP 136/84 | HR 82 | Temp 97.9°F | Ht 73.0 in | Wt 199.8 lb

## 2017-11-16 DIAGNOSIS — R197 Diarrhea, unspecified: Secondary | ICD-10-CM

## 2017-11-16 DIAGNOSIS — F410 Panic disorder [episodic paroxysmal anxiety] without agoraphobia: Secondary | ICD-10-CM

## 2017-11-16 NOTE — Patient Instructions (Signed)
It was very nice to see you today!  Please decrease your valium to 5mg  every 8 hours. Do not take more than 10mg  in a 6 hour period.  Please increase your lexapro to 20mg  in 1 week. Let me know if you have severe side effects.  Come back to see me in 4-6 weeks, or sooner as needed.  Take care, Dr [name]

## 2017-11-16 NOTE — Progress Notes (Signed)
   Subjective:  Jim Hatfield is a 47 y.o. male who presents today with a chief complaint of diarrhea.   HPI:  Diarrhea, new problem Started after starting lexapro last week. Lasted for several days. Associated with decreased appetite and abdominal discomfort. Fortunately, symptoms have improved significantly over the last few days.  He has had 2 small bowel movements of normal consistency of the last couple of days.  No hematochezia.  He has had some weight loss that he is associated with his decreased appetite and decreased food intake.  He has had a little bit of lower abdominal discomfort which has also improved.  Anxiety, chronic problem, stable As noted above, patient started Lexapro about a week ago.  He feels like his anxiety symptoms have improved, however feels like the 10 mg of IM is too strong for him currently.  He is interested in cutting back to 5 mg.  Occasionally has some dizziness and feeling like he "drunk."  ROS: Per HPI  PMH: He reports that he has been smoking. He has been smoking about 0.50 packs per day. He has never used smokeless tobacco. He reports that he drinks alcohol. He reports that he does not use drugs.  Objective:  Physical Exam: BP 136/84 (BP Location: Left Arm, Patient Position: Sitting, Cuff Size: Normal)   Pulse 82   Temp 97.9 F (36.6 C) (Oral)   Ht 6\' 1"  (1.854 m)   Wt 199 lb 12.8 oz (90.6 kg)   SpO2 97%   BMI 26.36 kg/m   Wt Readings from Last 3 Encounters:  11/16/17 199 lb 12.8 oz (90.6 kg)  11/08/17 207 lb (93.9 kg)  10/28/17 213 lb (96.6 kg)  Gen: NAD, resting comfortably CV: RRR with no murmurs appreciated Pulm: NWOB, CTAB with no crackles, wheezes, or rhonchi GI: Normal bowel sounds present. Soft, Nontender, Nondistended.  Assessment/Plan:  Panic disorder Improving with Lexapro.  Continue current dose of 10 mg daily.  Will decrease Valium to 5 mg every 8 hours as needed.  Advised patient to not take any more than 10 mg in a 6-hour  period.  He will continue seeing our therapist.  He will follow-up with me in 4 to 6 weeks.  Diarrhea Likely side effect of Lexapro.  Encourage patient to continue his medications and advised him that symptoms should continue to improve over the next week.  Discussed reasons to return to care.  Encouraged good oral hydration.  Katina Degree. [name], MD 11/16/2017 8:44 AM

## 2017-11-16 NOTE — Assessment & Plan Note (Signed)
Improving with Lexapro.  Continue current dose of 10 mg daily.  Will decrease Valium to 5 mg every 8 hours as needed.  Advised patient to not take any more than 10 mg in a 6-hour period.  He will continue seeing our therapist.  He will follow-up with me in 4 to 6 weeks.

## 2017-11-17 ENCOUNTER — Encounter: Payer: Self-pay | Admitting: Physician Assistant

## 2017-11-17 ENCOUNTER — Ambulatory Visit (INDEPENDENT_AMBULATORY_CARE_PROVIDER_SITE_OTHER): Payer: BLUE CROSS/BLUE SHIELD | Admitting: Physician Assistant

## 2017-11-17 VITALS — BP 134/82 | HR 82 | Temp 98.6°F | Wt 198.8 lb

## 2017-11-17 DIAGNOSIS — F410 Panic disorder [episodic paroxysmal anxiety] without agoraphobia: Secondary | ICD-10-CM

## 2017-11-17 DIAGNOSIS — I471 Supraventricular tachycardia: Secondary | ICD-10-CM | POA: Diagnosis not present

## 2017-11-17 DIAGNOSIS — I10 Essential (primary) hypertension: Secondary | ICD-10-CM | POA: Diagnosis not present

## 2017-11-17 DIAGNOSIS — I493 Ventricular premature depolarization: Secondary | ICD-10-CM | POA: Diagnosis not present

## 2017-11-17 DIAGNOSIS — R002 Palpitations: Secondary | ICD-10-CM

## 2017-11-17 NOTE — Patient Instructions (Signed)
It was great to see you!  Please let us know if there is anything that we can do for you.  Take care,  Jarold Motto PA-C

## 2017-11-17 NOTE — Progress Notes (Signed)
Jim Hatfield is a 47 y.o. male here for a follow up of a pre-existing problem.  History of Present Illness:   Chief Complaint  Patient presents with  . Follow-up    PALPITATIONS     HPI   This morning around 7am patient was pouring water in his kitchen and felt some chest pressure and checked his pulse, and was concerned that he was having irregular and rapid heart rate. He has a significant history of panic disorder and anxiety stemming from a prior episode of SVT. He was anxious about this episode this morning and walked in our office to be evaluated. He denies chest pain, SOB, numbness/tingling to extremities, slurred speech, vision changes. He is taking his medications as prescribed. Continues to smoke, states that he has "cut down."  He is working closely with our in-office therapist, Colen Darling, on his current mental health issues.   Past Medical History:  Diagnosis Date  . Anxiety   . Cardiac arrhythmia due to congenital heart disease   . Depression   . Diverticulitis   . Diverticulitis   . SVT (supraventricular tachycardia) (HCC)      Social History   Socioeconomic History  . Marital status: Divorced    Spouse name: Not on file  . Number of children: Not on file  . Years of education: Not on file  . Highest education level: Not on file  Occupational History  . Not on file  Social Needs  . Financial resource strain: Not on file  . Food insecurity:    Worry: Not on file    Inability: Not on file  . Transportation needs:    Medical: Not on file    Non-medical: Not on file  Tobacco Use  . Smoking status: Current Every Day Smoker    Packs/day: 0.50  . Smokeless tobacco: Never Used  Substance and Sexual Activity  . Alcohol use: Yes    Comment: socially  . Drug use: No  . Sexual activity: Yes  Lifestyle  . Physical activity:    Days per week: Not on file    Minutes per session: Not on file  . Stress: Not on file  Relationships  . Social connections:   Talks on phone: Not on file    Gets together: Not on file    Attends religious service: Not on file    Active member of club or organization: Not on file    Attends meetings of clubs or organizations: Not on file    Relationship status: Not on file  . Intimate partner violence:    Fear of current or ex partner: Not on file    Emotionally abused: Not on file    Physically abused: Not on file    Forced sexual activity: Not on file  Other Topics Concern  . Not on file  Social History Narrative  . Not on file    Past Surgical History:  Procedure Laterality Date  . FOOT SURGERY Left     Family History  Problem Relation Age of Onset  . Cancer Mother        Breast  . Hypertension Mother   . Cancer Father 24       Pancreatic   . Heart attack Father   . Hyperlipidemia Father   . Hypertension Father   . Heart disease Father   . Depression Sister   . Depression Brother     Allergies  Allergen Reactions  . Ativan [Lorazepam] Other (See Comments)  Jittery    Current Medications:   Current Outpatient Medications:  .  diazepam (VALIUM) 10 MG tablet, Take 1 tablet (10 mg total) by mouth every 8 (eight) hours as needed for anxiety., Disp: 90 tablet, Rfl: 1 .  escitalopram (LEXAPRO) 10 MG tablet, Take 2 tablets (20 mg total) by mouth daily., Disp: 60 tablet, Rfl: 5 .  Insulin Pen Needle (B-D UF III MINI PEN NEEDLES) 31G X 5 MM MISC, USE DAILY AS NEEDED, Disp: 100 each, Rfl: 11 .  Liraglutide -Weight Management (SAXENDA) 18 MG/3ML SOPN, Inject 3 mg into the skin daily., Disp: 5 pen, Rfl: 11 .  meloxicam (MOBIC) 15 MG tablet, Take 1 tablet (15 mg total) by mouth daily., Disp: 30 tablet, Rfl: 0 .  propranolol ER (INDERAL LA) 120 MG 24 hr capsule, Take 1 capsule (120 mg total) by mouth daily. Takes once per day., Disp: 90 capsule, Rfl: 3   Review of Systems:   ROS Negative unless otherwise specified per HPI.  Vitals:   Vitals:   11/17/17 0858  BP: 134/82  Pulse: 82  Temp:  98.6 F (37 C)  TempSrc: Oral  SpO2: 98%  Weight: 198 lb 12.8 oz (90.2 kg)     Body mass index is 26.23 kg/m.  Physical Exam:   Physical Exam  Constitutional: He appears well-developed. He is cooperative.  Non-toxic appearance. He does not have a sickly appearance. He does not appear ill. No distress.  Cardiovascular: Normal rate, regular rhythm, S1 normal, S2 normal, normal heart sounds and normal pulses.  No LE edema  Pulmonary/Chest: Effort normal and breath sounds normal.  Neurological: He is alert. GCS eye subscore is 4. GCS verbal subscore is 5. GCS motor subscore is 6.  Skin: Skin is warm, dry and intact.  Psychiatric: His speech is normal and behavior is normal. His mood appears anxious.  Tearful, constantly checking his radial pulse throughout our encounter  Nursing note and vitals reviewed.  EKG tracing is personally reviewed.  EKG notes NSR.  No acute changes.   Assessment and Plan:   Panic disorder Management per PCP. Overall, he states that he is improving on current regimen. In office therapist, Colen Darling, in to see patient briefly during our encounter -- appreciate coordination of care.  Palpitations EKG normal without evidence of PVCs or SVT. Discussed results with patient and provided reassurance. No red flags on exam or based on history obtained. Recommended follow-up with PCP if further concerns. Patient verbalized understanding.   . Reviewed expectations re: course of current medical issues. . Discussed self-management of symptoms. . Outlined signs and symptoms indicating need for more acute intervention. . Patient verbalized understanding and all questions were answered. . See orders for this visit as documented in the electronic medical record. . Patient received an After-Visit Summary.  Jarold Motto, PA-C

## 2017-11-17 NOTE — Assessment & Plan Note (Signed)
Management per PCP. Overall, he states that he is improving on current regimen. In office therapist, Colen Darling, in to see patient briefly during our encounter -- appreciate coordination of care.

## 2017-11-18 ENCOUNTER — Encounter: Payer: Self-pay | Admitting: Family Medicine

## 2017-11-18 ENCOUNTER — Ambulatory Visit: Payer: BLUE CROSS/BLUE SHIELD | Admitting: Family Medicine

## 2017-11-18 VITALS — BP 126/78 | HR 87 | Temp 98.5°F | Ht 73.0 in | Wt 198.0 lb

## 2017-11-18 DIAGNOSIS — I491 Atrial premature depolarization: Secondary | ICD-10-CM | POA: Insufficient documentation

## 2017-11-18 DIAGNOSIS — L739 Follicular disorder, unspecified: Secondary | ICD-10-CM | POA: Diagnosis not present

## 2017-11-18 NOTE — Assessment & Plan Note (Addendum)
Had a lengthy discussion with patient regarding recommendations from his cardiologist and the recommended supplements.  Advised patient that it would be safe for him to start fish oil, B12, and vitamin B6.  He is very anxious about any possible interactions. No expected interactions with his current medications.  We will defer further management to his cardiologist.  Of note, patient's EKG here yesterday revealed no ectopic beats.  Will obtain records from his cardiologist.

## 2017-11-18 NOTE — Progress Notes (Signed)
   Subjective:  Jim Hatfield is a 47 y.o. male who presents today with a chief complaint of ectopic beats.   HPI:  Ectopy, chronic problem, stable Patient was seen yesterday by her physician's assistant for recurrent premature beats.  He was evaluated including EKG which was normal.  His symptoms persisted he subsequently went to his cardiology office.  Per patient report, he was diagnosed with a "major PVC event."  He was recommended to start fish oil, vitamin B12, and vitamin B6.  He has picked the supplements up, but is concerned about any possible interaction with his other medications.  His cardiologist reportedly told him that if the symptoms did not improve with the above, he would consider starting him on an antiarrhythmic agent.  Symptoms are better than they were yesterday.  Folliculitis Started 2 days ago.  Located on right cheek.  No treatments tried.  ROS: Per HPI  PMH: He reports that he has been smoking. He has been smoking about 0.50 packs per day. He has never used smokeless tobacco. He reports that he drinks alcohol. He reports that he does not use drugs.  Objective:  Physical Exam: BP 126/78 (BP Location: Left Arm, Patient Position: Sitting, Cuff Size: Normal)   Pulse 87   Temp 98.5 F (36.9 C) (Oral)   Ht 6\' 1"  (1.854 m)   Wt 198 lb (89.8 kg)   SpO2 97%   BMI 26.12 kg/m   Gen: NAD, resting comfortably HEENT: Inflamed hair follicle on right zygomatic process  Assessment/Plan:  PAC (premature atrial contraction) Had a lengthy discussion with patient regarding recommendations from his cardiologist and the recommended supplements.  Advised patient that it would be safe for him to start fish oil, B12, and vitamin B6.  He is very anxious about any possible interactions. No expected interactions with his current medications.  We will defer further management to his cardiologist.  Of note, patient's EKG here yesterday revealed no ectopic beats.  Will obtain records  from his cardiologist.  Folliculitis Reassured patient.  Recommended warm compresses.  Time Spent: I spent >25 minutes face-to-face with the patient, with more than half spent on counseling for management plan for his ectopic beats and folliculitis.   Katina Degree. [name], MD 11/18/2017 11:07 AM

## 2017-11-23 ENCOUNTER — Ambulatory Visit: Payer: BLUE CROSS/BLUE SHIELD | Admitting: Psychology

## 2017-11-23 ENCOUNTER — Encounter: Payer: Self-pay | Admitting: Family Medicine

## 2017-11-23 ENCOUNTER — Ambulatory Visit: Payer: BLUE CROSS/BLUE SHIELD | Admitting: Family Medicine

## 2017-11-23 VITALS — BP 124/72 | HR 88 | Temp 98.5°F | Ht 73.0 in | Wt 195.0 lb

## 2017-11-23 DIAGNOSIS — F410 Panic disorder [episodic paroxysmal anxiety] without agoraphobia: Secondary | ICD-10-CM

## 2017-11-23 DIAGNOSIS — I491 Atrial premature depolarization: Secondary | ICD-10-CM | POA: Diagnosis not present

## 2017-11-23 DIAGNOSIS — R1013 Epigastric pain: Secondary | ICD-10-CM | POA: Diagnosis not present

## 2017-11-23 DIAGNOSIS — Z7689 Persons encountering health services in other specified circumstances: Secondary | ICD-10-CM

## 2017-11-23 NOTE — Assessment & Plan Note (Signed)
Symptoms are improving with fish oil, B12, and B6 supplements.

## 2017-11-23 NOTE — Assessment & Plan Note (Signed)
Symptoms continue to improve.  We will continue Lexapro 20 mg daily.  Also continue Valium 10 mg every 8 hours as needed.  He will continue seeing her therapist.  Follow-up with me in 2 to 3 weeks.

## 2017-11-23 NOTE — Assessment & Plan Note (Signed)
BMI 25.7 today.  Will stop Saxenda as noted above.  Discussed possible increase in appetite with stopping Saxenda.  He will continue with lifestyle modifications.

## 2017-11-23 NOTE — Progress Notes (Signed)
   Subjective:  Jim Hatfield is a 47 y.o. male who presents today with a chief complaint of abdominal discomfort.   HPI:  Abdominal Discomfort, chronic problem Symptoms improved yesterday after he forgot to take his Saxenda.  Later in the afternoon, he took his [place] and had a return of his symptoms.  He is interested in possibly stopping Saxenda today.  Panic Disorder Increased to 20mg  lexapro yesterday. Tolerating well without obvious side effects.  Overall feels like his mood and anxiety levels are better.  He is still taking Valium 10 mg every 8 hours as needed.  ROS: Per HPI  PMH: He reports that he has been smoking. He has been smoking about 0.50 packs per day. He has never used smokeless tobacco. He reports that he drinks alcohol. He reports that he does not use drugs.  Objective:  Physical Exam: BP 124/72 (BP Location: Left Arm, Patient Position: Sitting, Cuff Size: Normal)   Pulse 88   Temp 98.5 F (36.9 C) (Oral)   Ht 6\' 1"  (1.854 m)   Wt 195 lb (88.5 kg)   SpO2 97%   BMI 25.73 kg/m   Gen: NAD, resting comfortably CV: RRR with no murmurs appreciated Pulm: NWOB, CTAB with no crackles, wheezes, or rhonchi GI: Normal bowel sounds present. Soft, Nontender, Nondistended.  Assessment/Plan:  Panic disorder Symptoms continue to improve.  We will continue Lexapro 20 mg daily.  Also continue Valium 10 mg every 8 hours as needed.  He will continue seeing her therapist.  Follow-up with me in 2 to 3 weeks.  Epigastric abdominal pain We will stop Saxenda as this could be contributing to his abdominal pain and discomfort.  Additionally, he has now down to a BMI of 25 and likely does not need Saxenda any longer.  If symptoms persist, would consider trial of PPI or H2 blocker.  PAC (premature atrial contraction) Symptoms are improving with fish oil, B12, and B6 supplements.    Encounter for weight management BMI 25.7 today.  Will stop Saxenda as noted above.  Discussed  possible increase in appetite with stopping Saxenda.  He will continue with lifestyle modifications.  Time Spent: I spent >25 minutes face-to-face with the patient, with more than half spent on counseling for management for his panic disorder, weight management, epigastric abdominal pain, and PACs.   Katina Degree. [name], MD 11/23/2017 8:28 AM

## 2017-11-23 NOTE — Assessment & Plan Note (Signed)
We will stop Saxenda as this could be contributing to his abdominal pain and discomfort.  Additionally, he has now down to a BMI of 25 and likely does not need Saxenda any longer.  If symptoms persist, would consider trial of PPI or H2 blocker.

## 2017-11-25 ENCOUNTER — Ambulatory Visit: Payer: BLUE CROSS/BLUE SHIELD | Admitting: Psychology

## 2017-11-25 ENCOUNTER — Ambulatory Visit: Payer: BLUE CROSS/BLUE SHIELD | Admitting: Family Medicine

## 2017-11-25 DIAGNOSIS — F411 Generalized anxiety disorder: Secondary | ICD-10-CM

## 2017-11-30 ENCOUNTER — Ambulatory Visit: Payer: BLUE CROSS/BLUE SHIELD | Admitting: Psychology

## 2017-11-30 ENCOUNTER — Ambulatory Visit: Payer: BLUE CROSS/BLUE SHIELD | Admitting: Family Medicine

## 2017-11-30 DIAGNOSIS — F410 Panic disorder [episodic paroxysmal anxiety] without agoraphobia: Secondary | ICD-10-CM

## 2017-12-07 ENCOUNTER — Ambulatory Visit: Payer: BLUE CROSS/BLUE SHIELD | Admitting: Psychology

## 2017-12-07 ENCOUNTER — Encounter: Payer: Self-pay | Admitting: Family Medicine

## 2017-12-07 ENCOUNTER — Ambulatory Visit: Payer: BLUE CROSS/BLUE SHIELD | Admitting: Family Medicine

## 2017-12-07 VITALS — BP 126/70 | HR 73 | Temp 97.6°F | Ht 73.0 in | Wt 191.8 lb

## 2017-12-07 DIAGNOSIS — F411 Generalized anxiety disorder: Secondary | ICD-10-CM | POA: Diagnosis not present

## 2017-12-07 DIAGNOSIS — L989 Disorder of the skin and subcutaneous tissue, unspecified: Secondary | ICD-10-CM

## 2017-12-07 DIAGNOSIS — Z23 Encounter for immunization: Secondary | ICD-10-CM

## 2017-12-07 DIAGNOSIS — F410 Panic disorder [episodic paroxysmal anxiety] without agoraphobia: Secondary | ICD-10-CM | POA: Diagnosis not present

## 2017-12-07 DIAGNOSIS — I491 Atrial premature depolarization: Secondary | ICD-10-CM | POA: Diagnosis not present

## 2017-12-07 DIAGNOSIS — F321 Major depressive disorder, single episode, moderate: Secondary | ICD-10-CM | POA: Diagnosis not present

## 2017-12-07 NOTE — Progress Notes (Signed)
Subjective:  Jim Hatfield is a 47 y.o. male who presents today with a chief complaint of anxiety follow up.   HPI:  Depression/Anxiety, established problem, Stable Patient last seen 2 weeks ago for this.  Symptoms seem to be improving.  Currently on Valium 10 mg 2-3 times daily and Lexapro 20 mg daily.  Still has quite a bit of anxiety however feels a little bit better.  Thinks that the medications are working.  He has been on Lexapro 20 mg daily for the last 2 to 3 weeks. He is interested in weaning off the Valium at some point in the near future.  Depression screen PHQ 2/9 12/07/2017  Decreased Interest 2  Down, Depressed, Hopeless 2  PHQ - 2 Score 4  Altered sleeping 2  Tired, decreased energy 2  Change in appetite 1  Feeling bad or failure about yourself  2  Trouble concentrating 2  Moving slowly or fidgety/restless 0  Suicidal thoughts 0  PHQ-9 Score 13  Difficult doing work/chores Very difficult    GAD 7 : Generalized Anxiety Score 12/07/2017  Nervous, Anxious, on Edge 3  Control/stop worrying 3  Worry too much - different things 3  Trouble relaxing 3  Restless 1  Easily annoyed or irritable 0  Afraid - awful might happen 3  Total GAD 7 Score 16  Anxiety Difficulty Very difficult    PACs, chronic problem, improving Patient currently on fish oil, vitamin E, and B12 supplements as directed by his cardiologist.  He has no significant improvement in his symptoms and has not had any major palpitations the last couple of weeks.  Recurrent folliculitis Patient has had a few spots of folliculitis on his arm and face with the last few weeks.  He has tried mupirocin gel in the past which worked well.  No reported fever chills.  ROS: Per HPI  PMH: He reports that he has been smoking. He has been smoking about 0.50 packs per day. He has never used smokeless tobacco. He reports that he drinks alcohol. He reports that he does not use drugs.  Objective:  Physical Exam: BP  126/70 (BP Location: Left Arm, Patient Position: Sitting, Cuff Size: Normal)   Pulse 73   Temp 97.6 F (36.4 C) (Oral)   Ht 6\' 1"  (1.854 m)   Wt 191 lb 12.8 oz (87 kg)   SpO2 98%   BMI 25.30 kg/m   Gen: NAD, resting comfortably CV: RRR with no murmurs appreciated Pulm: NWOB, CTAB with no crackles, wheezes, or rhonchi Skin: Erythematous, indurated hair follicles on left upper extremity and left side of face.  No surrounding erythema.  No purulence.  Assessment/Plan:  Skin lesion Patient with likely sebaceous cyst that has right axilla.  Also has recurrent folliculitis.  Advised patient to use over-the-counter antibiotic ointment as needed to the areas of inflammation.  Also discussed watchful waiting for area in the right axilla and left face.  Offered dermatology referral for excision however patient declined.  Panic disorder Improving.  Continue Lexapro 20 mg daily.  Patient would like to wean off his Valium.  Advised him to wait another 2 to 3 weeks and then decrease to 5 mg 3 times daily.  He will follow-up with me in 4 to 6 weeks.  PAC (premature atrial contraction) Doing much better.  No ectopic beats on exam today.  Continue fish oil and B12 supplements as directed by his cardiologist.  Depression, major, single episode, moderate (HCC) See panic disorder  A/P.  Continue Lexapro 20 mg daily.  We will attempt to wean down his Valium dose within the next few weeks.  He will follow-up in 4 to 6 weeks.  Preventive health care Flu shot given today.  Katina Degreealeb M. Jimmey RalphParker, MD 12/07/2017 9:14 AM

## 2017-12-07 NOTE — Assessment & Plan Note (Signed)
See panic disorder A/P.  Continue Lexapro 20 mg daily.  We will attempt to wean down his Valium dose within the next few weeks.  He will follow-up in 4 to 6 weeks.

## 2017-12-07 NOTE — Patient Instructions (Signed)
It was very nice to see you today!  I am glad you are feeling better.  Please wait another 2-4 weeks before decreasing your vallium dosage.  You can try using hibiclens to prevent skin infections.  We will give you your flu shot today.  Come back to see me in 4-6 weeks, or sooner as needed.   Take care, Dr Jimmey RalphParker

## 2017-12-07 NOTE — Assessment & Plan Note (Signed)
Patient with likely sebaceous cyst that has right axilla.  Also has recurrent folliculitis.  Advised patient to use over-the-counter antibiotic ointment as needed to the areas of inflammation.  Also discussed watchful waiting for area in the right axilla and left face.  Offered dermatology referral for excision however patient declined.

## 2017-12-07 NOTE — Assessment & Plan Note (Signed)
Doing much better.  No ectopic beats on exam today.  Continue fish oil and B12 supplements as directed by his cardiologist.

## 2017-12-07 NOTE — Assessment & Plan Note (Signed)
Improving.  Continue Lexapro 20 mg daily.  Patient would like to wean off his Valium.  Advised him to wait another 2 to 3 weeks and then decrease to 5 mg 3 times daily.  He will follow-up with me in 4 to 6 weeks.

## 2017-12-16 ENCOUNTER — Ambulatory Visit: Payer: BLUE CROSS/BLUE SHIELD | Admitting: Psychology

## 2017-12-16 DIAGNOSIS — F411 Generalized anxiety disorder: Secondary | ICD-10-CM | POA: Diagnosis not present

## 2017-12-21 ENCOUNTER — Ambulatory Visit: Payer: BLUE CROSS/BLUE SHIELD | Admitting: Psychology

## 2017-12-21 DIAGNOSIS — F411 Generalized anxiety disorder: Secondary | ICD-10-CM

## 2018-01-02 ENCOUNTER — Ambulatory Visit: Payer: BLUE CROSS/BLUE SHIELD | Admitting: Family Medicine

## 2018-01-04 ENCOUNTER — Ambulatory Visit: Payer: BLUE CROSS/BLUE SHIELD | Admitting: Family Medicine

## 2018-01-04 ENCOUNTER — Ambulatory Visit: Payer: BLUE CROSS/BLUE SHIELD | Admitting: Psychology

## 2018-01-04 DIAGNOSIS — F410 Panic disorder [episodic paroxysmal anxiety] without agoraphobia: Secondary | ICD-10-CM

## 2018-01-05 ENCOUNTER — Ambulatory Visit: Payer: BLUE CROSS/BLUE SHIELD | Admitting: Family Medicine

## 2018-01-09 ENCOUNTER — Ambulatory Visit: Payer: BLUE CROSS/BLUE SHIELD | Admitting: Family Medicine

## 2018-01-09 ENCOUNTER — Encounter: Payer: Self-pay | Admitting: Family Medicine

## 2018-01-09 VITALS — BP 120/74 | HR 68 | Temp 97.8°F | Ht 73.0 in | Wt 207.2 lb

## 2018-01-09 DIAGNOSIS — F17200 Nicotine dependence, unspecified, uncomplicated: Secondary | ICD-10-CM

## 2018-01-09 DIAGNOSIS — F321 Major depressive disorder, single episode, moderate: Secondary | ICD-10-CM

## 2018-01-09 DIAGNOSIS — F410 Panic disorder [episodic paroxysmal anxiety] without agoraphobia: Secondary | ICD-10-CM | POA: Diagnosis not present

## 2018-01-09 NOTE — Assessment & Plan Note (Signed)
Significant improvement.  See panic disorder A/P.  Continue Lexapro.  Wean off Valium.  Follow-up with me in 3 to 6 months.

## 2018-01-09 NOTE — Assessment & Plan Note (Signed)
Discussed treatment options with patient.  He wishes to quit cold [place].  Does not want nicotine replacement.  Given his significant anxiety disorder with depression, would not start Wellbutrin or Chantix either.  Patient wishes until his anxiety is under a little bit better control before stopping.  I think this is reasonable.

## 2018-01-09 NOTE — Assessment & Plan Note (Signed)
Significant improvement since our last visit.  We will continue Lexapro 20 mg daily.  Recommended him wean down Valium to 5 mg twice daily for 2 weeks, then as needed.  Patient voiced understanding.  Follow-up with me in 3 to 6 months.  Continue therapy.

## 2018-01-09 NOTE — Progress Notes (Signed)
Subjective:  Jim Hatfield is a 47 y.o. male who presents today with a chief complaint of anxiety follow up.   HPI:  Depression/Anxiety, chronic problems, improving Patient last seen about a month ago for both of these problems.  At that time, he was on Lexapro 10 mg daily and Valium 10 mg 3 times daily.  He has had significant improvement in his symptoms over the last month or so.  He is still on Lexapro 20 mg daily and tolerating well.  He has been able to wean down his Valium to 5 mg 3 times daily over the last week seems to have done well with this.  Recently, he has been able to do more activities that he enjoys including watching football.    Depression screen PHQ 2/9 01/09/2018  Decreased Interest 1  Down, Depressed, Hopeless 1  PHQ - 2 Score 2  Altered sleeping 1  Tired, decreased energy 1  Change in appetite 2  Feeling bad or failure about yourself  1  Trouble concentrating 2  Moving slowly or fidgety/restless 0  Suicidal thoughts 0  PHQ-9 Score 9  Difficult doing work/chores Not difficult at all    GAD 7 : Generalized Anxiety Score 01/09/2018  Nervous, Anxious, on Edge 1  Control/stop worrying 1  Worry too much - different things 1  Trouble relaxing 1  Restless 0  Easily annoyed or irritable 0  Afraid - awful might happen 1  Total GAD 7 Score 5  Anxiety Difficulty Not difficult at all   Nicotine Dependence Patient wishes to stop smoking.  He is concerned about potential withdrawal effects leading to his history of PACs and anxiety.  He would like to stop cold [place].  He has tried nicotine patches and lozenges in the past however was not able to tolerate due to side effects.  Currently smokes less than a pack a day.  ROS: Per HPI  PMH: He reports that he has been smoking. He has been smoking about 0.50 packs per day. He has never used smokeless tobacco. He reports that he drinks alcohol. He reports that he does not use drugs.  Objective:  Physical Exam: BP  120/74 (BP Location: Left Arm, Patient Position: Sitting, Cuff Size: Normal)   Pulse 68   Temp 97.8 F (36.6 C) (Oral)   Ht 6\' 1"  (1.854 m)   Wt 207 lb 3.2 oz (94 kg)   SpO2 97%   BMI 27.34 kg/m   Wt Readings from Last 3 Encounters:  01/09/18 207 lb 3.2 oz (94 kg)  12/07/17 191 lb 12.8 oz (87 kg)  11/23/17 195 lb (88.5 kg)  Gen: NAD, resting comfortablyV: RRR with no murmurs appreciated Pulm: NWOB, CTAB with no crackles, wheezes, or rhonchi  Assessment/Plan:  Panic disorder Significant improvement since our last visit.  We will continue Lexapro 20 mg daily.  Recommended him wean down Valium to 5 mg twice daily for 2 weeks, then as needed.  Patient voiced understanding.  Follow-up with me in 3 to 6 months.  Continue therapy.  Nicotine dependence with current use Discussed treatment options with patient.  He wishes to quit cold [place].  Does not want nicotine replacement.  Given his significant anxiety disorder with depression, would not start Wellbutrin or Chantix either.  Patient wishes until his anxiety is under a little bit better control before stopping.  I think this is reasonable.  Depression, major, single episode, moderate (HCC) Significant improvement.  See panic disorder A/P.  Continue  Lexapro.  Wean off Valium.  Follow-up with me in 3 to 6 months.  Jim Hatfield. Jim Ralph, MD 01/09/2018 12:29 PM

## 2018-01-09 NOTE — Patient Instructions (Signed)
It was very nice to see you today!  I am glad that you are doing much better!  No medication changes today.  Please try decreasing your valium to twice daily for a few weeks, then as needed.  You can try cutting down your cigarettes by 1 or 2.  This will help mitigate withdrawal effects.  Come back to see me in 6 months for checkup, or sooner as needed  Take care, Dr [name]

## 2018-01-11 ENCOUNTER — Ambulatory Visit: Payer: BLUE CROSS/BLUE SHIELD | Admitting: Psychology

## 2018-01-11 DIAGNOSIS — F410 Panic disorder [episodic paroxysmal anxiety] without agoraphobia: Secondary | ICD-10-CM | POA: Diagnosis not present

## 2018-01-18 ENCOUNTER — Ambulatory Visit: Payer: BLUE CROSS/BLUE SHIELD | Admitting: Psychology

## 2018-01-18 DIAGNOSIS — F410 Panic disorder [episodic paroxysmal anxiety] without agoraphobia: Secondary | ICD-10-CM

## 2018-01-25 ENCOUNTER — Ambulatory Visit: Payer: BLUE CROSS/BLUE SHIELD | Admitting: Psychology

## 2018-01-25 DIAGNOSIS — F410 Panic disorder [episodic paroxysmal anxiety] without agoraphobia: Secondary | ICD-10-CM

## 2018-02-01 ENCOUNTER — Ambulatory Visit: Payer: BLUE CROSS/BLUE SHIELD | Admitting: Psychology

## 2018-02-07 ENCOUNTER — Ambulatory Visit: Payer: BLUE CROSS/BLUE SHIELD | Admitting: Psychology

## 2018-02-07 DIAGNOSIS — F410 Panic disorder [episodic paroxysmal anxiety] without agoraphobia: Secondary | ICD-10-CM | POA: Diagnosis not present

## 2018-02-21 ENCOUNTER — Other Ambulatory Visit: Payer: Self-pay | Admitting: Family Medicine

## 2018-02-21 NOTE — Telephone Encounter (Signed)
Rx request 

## 2018-03-02 ENCOUNTER — Ambulatory Visit: Payer: BLUE CROSS/BLUE SHIELD | Admitting: Psychology

## 2018-03-02 DIAGNOSIS — F410 Panic disorder [episodic paroxysmal anxiety] without agoraphobia: Secondary | ICD-10-CM

## 2018-03-16 ENCOUNTER — Ambulatory Visit (INDEPENDENT_AMBULATORY_CARE_PROVIDER_SITE_OTHER): Payer: BLUE CROSS/BLUE SHIELD | Admitting: Psychology

## 2018-03-16 DIAGNOSIS — F410 Panic disorder [episodic paroxysmal anxiety] without agoraphobia: Secondary | ICD-10-CM

## 2018-03-29 ENCOUNTER — Other Ambulatory Visit: Payer: Self-pay | Admitting: Family Medicine

## 2018-04-05 ENCOUNTER — Ambulatory Visit: Payer: BLUE CROSS/BLUE SHIELD | Admitting: Psychology

## 2018-04-05 DIAGNOSIS — F410 Panic disorder [episodic paroxysmal anxiety] without agoraphobia: Secondary | ICD-10-CM

## 2018-04-10 ENCOUNTER — Other Ambulatory Visit: Payer: Self-pay | Admitting: Family Medicine

## 2018-04-10 NOTE — Telephone Encounter (Signed)
Please advise 

## 2018-05-07 ENCOUNTER — Other Ambulatory Visit: Payer: Self-pay | Admitting: Family Medicine

## 2018-05-09 ENCOUNTER — Ambulatory Visit: Payer: BLUE CROSS/BLUE SHIELD | Admitting: Psychology

## 2018-05-09 DIAGNOSIS — F410 Panic disorder [episodic paroxysmal anxiety] without agoraphobia: Secondary | ICD-10-CM

## 2018-05-25 ENCOUNTER — Ambulatory Visit: Payer: BLUE CROSS/BLUE SHIELD | Admitting: Family Medicine

## 2018-05-25 ENCOUNTER — Encounter: Payer: Self-pay | Admitting: Family Medicine

## 2018-05-25 ENCOUNTER — Other Ambulatory Visit: Payer: Self-pay

## 2018-05-25 VITALS — BP 124/74 | HR 77 | Temp 98.6°F | Ht 73.0 in | Wt 245.8 lb

## 2018-05-25 DIAGNOSIS — M25512 Pain in left shoulder: Secondary | ICD-10-CM | POA: Diagnosis not present

## 2018-05-25 DIAGNOSIS — F410 Panic disorder [episodic paroxysmal anxiety] without agoraphobia: Secondary | ICD-10-CM

## 2018-05-25 DIAGNOSIS — F321 Major depressive disorder, single episode, moderate: Secondary | ICD-10-CM | POA: Diagnosis not present

## 2018-05-25 DIAGNOSIS — G8929 Other chronic pain: Secondary | ICD-10-CM

## 2018-05-25 DIAGNOSIS — F17200 Nicotine dependence, unspecified, uncomplicated: Secondary | ICD-10-CM

## 2018-05-25 NOTE — Assessment & Plan Note (Addendum)
Stable.  Continue Lexapro 20mg  daily and Valium 5 mg 3 times daily as needed.  Follow-up with me in 6 months.

## 2018-05-25 NOTE — Progress Notes (Signed)
   Chief Complaint:  Jim Hatfield is a 48 y.o. male who presents today with a chief complaint of anxiety follow up.   Assessment/Plan:  Left Shoulder Pain Consistent with mild rotator cuff tendinopathy.  Seems to be improving.  Discussed home exercise program handout was given.  Continue on 50 mg daily as needed.  Panic disorder Stable.  Continue Lexapro 20mg  daily and Valium 5 mg 3 times daily as needed.  Follow-up with me in 6 months.  Nicotine dependence with current use Patient was asked about his tobacco use today and was strongly advised to quit. Patient is currently not contemplative. We reviewed treatment options to assist him quit smoking including NRT, Chantix, and Bupropion. Follow up at next office visit.   Total time spent counseling approximately 3 minutes.    Depression, major, single episode, moderate (HCC) Stable.  Continue Lexapro 20 mg daily and Valium 5 mg 3 times daily as needed.  Follow-up in 6 months.     Subjective:  HPI:  Shoulder pain Patient has had some left-sided shoulder pain for the past few weeks.  Has a history of bilateral injury.  No clear precipitating event for shoulder pain however thinks he slept on it wrong.  Has tried stretches and exercise.  Symptoms seem to be improving.  His stable, chronic medical conditions are outlined below:  #Panic Disorder / Depression - Doing well on lexapro 20mg  daily and valium 5mg  three times daily as needed - ROS: No SI or HI  # Nicotine Dependence -Smokes about half pack per day.  ROS: Per HPI  PMH: He reports that he has been smoking. He has been smoking about 0.50 packs per day. He has never used smokeless tobacco. He reports current alcohol use. He reports that he does not use drugs.      Objective:  Physical Exam: BP 124/74 (BP Location: Left Arm, Patient Position: Sitting, Cuff Size: Normal)   Pulse 77   Temp 98.6 F (37 C) (Oral)   Ht 6\' 1"  (1.854 m)   Wt 245 lb 12.8 oz (111.5 kg)   SpO2  95%   BMI 32.43 kg/m   Gen: NAD, resting comfortably CV: Regular rate and rhythm with no murmurs appreciated Pulm: Normal work of breathing, clear to auscultation bilaterally with no crackles, wheezes, or rhonchi MSK:  -Left shoulder: No deformities.  Strength out of 5 throughout.  Normal internal and external rotation.  Hawkins test positive. Skin: Warm, dry Neuro: Grossly normal, moves all extremities Psych: Normal affect and thought content     Caleb M. [name], MD 05/25/2018 4:37 PM

## 2018-05-25 NOTE — Assessment & Plan Note (Signed)
Patient was asked about his tobacco use today and was strongly advised to quit. Patient is currently not contemplative. We reviewed treatment options to assist him quit smoking including NRT, Chantix, and Bupropion. Follow up at next office visit.   Total time spent counseling approximately 3 minutes.   

## 2018-05-25 NOTE — Patient Instructions (Addendum)
It was very nice to see you today!  Please work on the exercises.  Take mobic as needed.  Come back in 6 months or sooner as needed.   Take care, Dr [name]

## 2018-05-25 NOTE — Assessment & Plan Note (Signed)
Stable.  Continue Lexapro 20 mg daily and Valium 5 mg 3 times daily as needed.  Follow-up in 6 months.

## 2018-06-16 ENCOUNTER — Ambulatory Visit: Payer: BLUE CROSS/BLUE SHIELD | Admitting: Psychology

## 2018-06-26 ENCOUNTER — Other Ambulatory Visit: Payer: Self-pay | Admitting: Family Medicine

## 2018-07-07 ENCOUNTER — Other Ambulatory Visit: Payer: Self-pay | Admitting: Family Medicine

## 2018-07-07 NOTE — Telephone Encounter (Signed)
Please advise 

## 2018-08-24 ENCOUNTER — Other Ambulatory Visit: Payer: Self-pay | Admitting: Family Medicine

## 2018-09-05 ENCOUNTER — Other Ambulatory Visit: Payer: Self-pay | Admitting: Family Medicine

## 2018-09-05 NOTE — Telephone Encounter (Signed)
Please advise 

## 2018-09-22 ENCOUNTER — Other Ambulatory Visit: Payer: Self-pay | Admitting: Family Medicine

## 2018-10-04 ENCOUNTER — Other Ambulatory Visit: Payer: Self-pay | Admitting: Family Medicine

## 2018-10-25 ENCOUNTER — Encounter: Payer: Self-pay | Admitting: Family Medicine

## 2018-10-25 ENCOUNTER — Ambulatory Visit: Payer: BLUE CROSS/BLUE SHIELD | Admitting: Family Medicine

## 2018-10-25 ENCOUNTER — Other Ambulatory Visit: Payer: Self-pay

## 2018-10-25 VITALS — BP 142/80 | HR 90 | Temp 97.8°F | Resp 18 | Ht 73.0 in | Wt 268.0 lb

## 2018-10-25 DIAGNOSIS — L089 Local infection of the skin and subcutaneous tissue, unspecified: Secondary | ICD-10-CM | POA: Diagnosis not present

## 2018-10-25 DIAGNOSIS — L723 Sebaceous cyst: Secondary | ICD-10-CM

## 2018-10-25 MED ORDER — DOXYCYCLINE HYCLATE 100 MG PO TABS: 100.0000 mg | ORAL_TABLET | Freq: Two times a day (BID) | ORAL | 0 refills | Status: AC

## 2018-10-25 NOTE — Patient Instructions (Addendum)
Please return for your annual complete physical; please come fasting. With Dr. Jerline Pain. Due now.  Please schedule a follow up visit in 2-5 days with Dr. Jerline Pain to recheck your infection.   Use warm compresses twice daily; hopefully it will drain on its own.  Use advil 2-3tab 2-3 x / day for pain.  Take the antibiotics as directed.    If you have any questions or concerns, please don't hesitate to send me a message via MyChart or call the office at [phone]. Thank you for visiting with [place] today! It's our pleasure caring for you.

## 2018-10-25 NOTE — Progress Notes (Signed)
Subjective  CC:  Chief Complaint  Patient presents with  . Clogged sweat glad    Left side, near going area.. Started 8/8, reports that there is some infection, pain, redness and swelling   Same day acute visit; PCP not available. New pt to me. Chart reviewed.   HPI: Jim Hatfield is a 48 y.o. male who presents to the office today to address the problems listed above in the chief complaint.  48 yo with anxiety presents with infection on left upper inner thigh. Has had recurrent infections there due to infected "gland" and has needed I&D before. Reports redness with some drainage this am. Tender. No f/c/s.  Started 3-4 days ago.  Assessment  1. Infected sebaceous cyst      Plan   Suspect infected sebaceous cyst: pt would like to try to avoid I&D if possible. This is reasonable, although we discussed that if it doesn't get better he may need to return for I&D. Start bid warm compresses, doxy bid and advil. F/u if for recheck  Follow up: Return in about 2-5  days (around 10/27/2018) for recheck with Dr. Jerline Pain..  Visit date not found  No orders of the defined types were placed in this encounter.  Meds ordered this encounter  Medications  . doxycycline (VIBRA-TABS) 100 MG tablet    Sig: Take 1 tablet (100 mg total) by mouth 2 (two) times daily for 7 days.    Dispense:  14 tablet    Refill:  0      I reviewed the patients updated PMH, FH, and SocHx.    Patient Active Problem List   Diagnosis Date Noted  . Encounter for weight management 11/23/2017  . PAC (premature atrial contraction) 11/18/2017  . Epigastric abdominal pain 10/25/2017  . Depression, major, single episode, moderate ([place]) 09/19/2017  . Skin lesion 05/02/2017  . Chronic low back pain 03/29/2017  . Hypercholesteremia 10/25/2016  . Panic disorder 10/22/2016  . Nicotine dependence with current use 10/22/2016   Current Meds  Medication Sig  . diazepam (VALIUM) 10 MG tablet TAKE 1/2 TABLET BY MOUTH EVERY 6  HOURS AS NEEDED FOR ANXIETY  . escitalopram (LEXAPRO) 10 MG tablet TAKE TWO TABLETS BY MOUTH DAILY  . meloxicam (MOBIC) 15 MG tablet Take 1 tablet (15 mg total) by mouth daily.  . propranolol ER (INDERAL LA) 120 MG 24 hr capsule TAKE ONE CAPSULE BY MOUTH DAILY    Allergies: Patient is allergic to ativan [lorazepam]. Family History: Patient family history includes Cancer in his mother; Cancer (age of onset: 70) in his father; Depression in his brother and sister; Heart attack in his father; Heart disease in his father; Hyperlipidemia in his father; Hypertension in his father and mother. Social History:  Patient  reports that he has been smoking. He has been smoking about 0.50 packs per day. He has never used smokeless tobacco. He reports current alcohol use. He reports that he does not use drugs.  Review of Systems: Constitutional: Negative for fever malaise or anorexia Cardiovascular: negative for chest pain Respiratory: negative for SOB or persistent cough Gastrointestinal: negative for abdominal pain  Objective  Vitals: BP (!) 142/80   Pulse 90   Temp 97.8 F (36.6 C) (Tympanic)   Resp 18   Ht 6\' 1"  (1.854 m)   Wt 268 lb (121.6 kg)   BMI 35.36 kg/m  General: no acute distress but anxious appearing , A&Ox3 HEENT: PEERL, conjunctiva normal, Oropharynx moist,neck is supple Cardiovascular:  RRR  without murmur or gallop.  Respiratory:  Good breath sounds bilaterally, CTAB with normal respiratory effort Skin:  Warm, left upper inner thigh with red tender nodule with small head; minimal fluctuance.      Commons side effects, risks, benefits, and alternatives for medications and treatment plan prescribed today were discussed, and the patient expressed understanding of the given instructions. Patient is instructed to call or message via MyChart if he/she has any questions or concerns regarding our treatment plan. No barriers to understanding were identified. We discussed Red Flag  symptoms and signs in detail. Patient expressed understanding regarding what to do in case of urgent or emergency type symptoms.   Medication list was reconciled, printed and provided to the patient in AVS. Patient instructions and summary information was reviewed with the patient as documented in the AVS. This note was prepared with assistance of Dragon voice recognition software. Occasional wrong-word or sound-a-like substitutions may have occurred due to the inherent limitations of voice recognition software

## 2018-11-03 ENCOUNTER — Ambulatory Visit: Payer: Self-pay | Admitting: Cardiology

## 2018-11-06 ENCOUNTER — Other Ambulatory Visit: Payer: Self-pay | Admitting: Family Medicine

## 2018-11-08 ENCOUNTER — Ambulatory Visit (INDEPENDENT_AMBULATORY_CARE_PROVIDER_SITE_OTHER): Payer: BC Managed Care – PPO | Admitting: Family Medicine

## 2018-11-08 ENCOUNTER — Encounter: Payer: Self-pay | Admitting: Family Medicine

## 2018-11-08 DIAGNOSIS — F321 Major depressive disorder, single episode, moderate: Secondary | ICD-10-CM

## 2018-11-08 DIAGNOSIS — F410 Panic disorder [episodic paroxysmal anxiety] without agoraphobia: Secondary | ICD-10-CM | POA: Diagnosis not present

## 2018-11-08 DIAGNOSIS — F17200 Nicotine dependence, unspecified, uncomplicated: Secondary | ICD-10-CM

## 2018-11-08 DIAGNOSIS — E669 Obesity, unspecified: Secondary | ICD-10-CM

## 2018-11-08 MED ORDER — DIAZEPAM 10 MG PO TABS: ORAL_TABLET | ORAL | 5 refills | Status: DC

## 2018-11-08 NOTE — Assessment & Plan Note (Signed)
Stable.  Database reviewed with no red flags.  Continue Lexapro 20 mg daily and Valium 5 mg 3 times daily as needed.  Follow-up in 6 months.

## 2018-11-08 NOTE — Assessment & Plan Note (Signed)
Patient was asked about his tobacco use today and was strongly advised to quit. Patient is currently contemplative. We reviewed treatment options to assist him quit smoking including NRT, Chantix, and Bupropion. Follow up at next office visit.   Total time spent counseling approximately 6 minutes.

## 2018-11-08 NOTE — Assessment & Plan Note (Signed)
Stable.  Continue Lexapro 20 mg daily and Valium 5 mg 3 times daily as needed.  Database reviewed with no red flags.

## 2018-11-08 NOTE — Assessment & Plan Note (Signed)
Continue lifestyle modifications.  Discussed restarting Saxenda however patient declined.  He will follow-up with me in a few months if no significant improvement.

## 2018-11-08 NOTE — Progress Notes (Signed)
    Chief Complaint:  Jim Hatfield is a 48 y.o. male who presents today for a virtual office visit with a chief complaint of panic disorder.   Assessment/Plan:  Obesity Continue lifestyle modifications.  Discussed restarting Saxenda however patient declined.  He will follow-up with me in a few months if no significant improvement.  Depression, major, single episode, moderate (HCC) Stable.  Continue Lexapro 20 mg daily and Valium 5 mg 3 times daily as needed.  Database reviewed with no red flags.  Nicotine dependence with current use Patient was asked about his tobacco use today and was strongly advised to quit. Patient is currently contemplative. We reviewed treatment options to assist him quit smoking including NRT, Chantix, and Bupropion. Follow up at next office visit.   Total time spent counseling approximately 6 minutes.    Panic disorder Stable.  Database reviewed with no red flags.  Continue Lexapro 20 mg daily and Valium 5 mg 3 times daily as needed.  Follow-up in 6 months.     Subjective:  HPI:  His chronic medical conditions are outlined below:  #Panic Disorder / Depression - Doing well on lexapro 20mg  daily and valium 5mg  three times daily as needed - ROS: No SI or HI  # Nicotine Dependence -Smokes about half pack per day.   # Obesity - Not currently on any medications but has been on saxenda in the past which has worked well  ROS: Per HPI  PMH: He reports that he has been smoking. He has been smoking about 0.50 packs per day. He has never used smokeless tobacco. He reports current alcohol use. He reports that he does not use drugs.      Objective/Observations  Physical Exam: Gen: NAD, resting comfortably Pulm: Normal work of breathing Neuro: Grossly normal, moves all extremities Psych: Normal affect and thought content  Virtual Visit via Video   I connected with Jim Hatfield on 11/08/18 at 10:40 AM EDT by a video enabled telemedicine application  and verified that I am speaking with the correct person using two identifiers. I discussed the limitations of evaluation and management by telemedicine and the availability of in person appointments. The patient expressed understanding and agreed to proceed.   Patient location: Home Provider location: [place] participating in the virtual visit: Myself and Patient     Algis Greenhouse. Jerline Pain, MD 11/08/2018 9:14 AM

## 2018-12-25 ENCOUNTER — Other Ambulatory Visit: Payer: Self-pay | Admitting: Family Medicine

## 2019-01-24 ENCOUNTER — Other Ambulatory Visit: Payer: Self-pay | Admitting: Family Medicine

## 2019-02-01 ENCOUNTER — Other Ambulatory Visit: Payer: Self-pay | Admitting: Family Medicine

## 2019-02-22 ENCOUNTER — Other Ambulatory Visit: Payer: Self-pay | Admitting: Family Medicine

## 2019-03-24 ENCOUNTER — Other Ambulatory Visit: Payer: Self-pay | Admitting: Family Medicine

## 2019-03-27 ENCOUNTER — Other Ambulatory Visit: Payer: Self-pay | Admitting: Family Medicine

## 2019-03-28 ENCOUNTER — Encounter: Payer: Self-pay | Admitting: Family Medicine

## 2019-03-29 ENCOUNTER — Telehealth: Payer: Self-pay

## 2019-03-29 NOTE — Telephone Encounter (Signed)
  LAST APPOINTMENT DATE: @@LASTENCT @  NEXT APPOINTMENT DATE:@Visit  date not found  MEDICATION: propranolol ER (INDERAL LA) 120 MG 24 hr capsule   [facility]Y: [facility] [place], [place] - [address] Phone:  [phone]  Fax:  [phone]         Pt is requesting a call back as soon as possible. **Let patient know to contact pharmacy at the end of the day to make sure medication is ready. **  ** Please notify patient to allow 48-72 hours to process**  **Encourage patient to contact the pharmacy for refills or they can request refills through [facility]**  CLINICAL FILLS OUT ALL BELOW:   LAST REFILL:  QTY:  REFILL DATE:    OTHER COMMENTS:    Okay for refill?  Please advise

## 2019-04-17 ENCOUNTER — Other Ambulatory Visit: Payer: Self-pay

## 2019-04-17 ENCOUNTER — Ambulatory Visit (INDEPENDENT_AMBULATORY_CARE_PROVIDER_SITE_OTHER): Payer: BC Managed Care – PPO | Admitting: Family Medicine

## 2019-04-17 ENCOUNTER — Encounter: Payer: Self-pay | Admitting: Family Medicine

## 2019-04-17 DIAGNOSIS — F410 Panic disorder [episodic paroxysmal anxiety] without agoraphobia: Secondary | ICD-10-CM | POA: Diagnosis not present

## 2019-04-17 DIAGNOSIS — I491 Atrial premature depolarization: Secondary | ICD-10-CM | POA: Diagnosis not present

## 2019-04-17 DIAGNOSIS — F321 Major depressive disorder, single episode, moderate: Secondary | ICD-10-CM

## 2019-04-17 MED ORDER — ESCITALOPRAM OXALATE 10 MG PO TABS: 20.0000 mg | ORAL_TABLET | Freq: Every day | ORAL | 3 refills | Status: DC

## 2019-04-17 MED ORDER — PROPRANOLOL HCL ER 120 MG PO CP24: 120.0000 mg | ORAL_CAPSULE | Freq: Every day | ORAL | 3 refills | Status: DC

## 2019-04-17 MED ORDER — DIAZEPAM 10 MG PO TABS: ORAL_TABLET | ORAL | 1 refills | Status: DC

## 2019-04-17 NOTE — Assessment & Plan Note (Signed)
Stable.  Will refill Lexapro 20 mg daily and Valium 5 mg every 6 hours as needed.

## 2019-04-17 NOTE — Progress Notes (Signed)
   Jim Hatfield is a 49 y.o. male who presents today for a virtual office visit.  Assessment/Plan:  Chronic Problems Addressed Today: PAC (premature atrial contraction) Stable.  Will refill propanolol 120mg  daily.   Depression, major, single episode, moderate (HCC) Stable.  Continue Lexapro 20 mg daily.   Panic disorder Stable.  Will refill Lexapro 20 mg daily and Valium 5 mg every 6 hours as needed.     Subjective:  HPI:  See A/P.        Objective/Observations  Physical Exam: Gen: NAD, resting comfortably Pulm: Normal work of breathing Neuro: Grossly normal, moves all extremities Psych: Normal affect and thought content  Virtual Visit via Video   I connected with on 04/17/19 at 11:20 AM EST by a video enabled telemedicine application and verified that I am speaking with the correct person using two identifiers. The limitations of evaluation and management by telemedicine and the availability of in person appointments were discussed. The patient expressed understanding and agreed to proceed.   Patient location: Home Provider location: [place] 06/15/19 Persons participating in the virtual visit: Myself and Patient     [employer]. Katina Degree, MD 04/17/2019 11:45 AM

## 2019-04-17 NOTE — Assessment & Plan Note (Signed)
Stable.  Continue Lexapro 20mg daily

## 2019-04-17 NOTE — Assessment & Plan Note (Signed)
Stable.  Will refill propanolol 120mg  daily.

## 2019-05-02 ENCOUNTER — Other Ambulatory Visit: Payer: Self-pay | Admitting: Family Medicine

## 2019-05-28 ENCOUNTER — Encounter: Payer: Self-pay | Admitting: Family Medicine

## 2019-05-28 ENCOUNTER — Telehealth: Payer: Self-pay | Admitting: Family Medicine

## 2019-05-28 ENCOUNTER — Ambulatory Visit (INDEPENDENT_AMBULATORY_CARE_PROVIDER_SITE_OTHER): Payer: BC Managed Care – PPO | Admitting: Family Medicine

## 2019-05-28 ENCOUNTER — Ambulatory Visit: Payer: BC Managed Care – PPO | Admitting: Physician Assistant

## 2019-05-28 ENCOUNTER — Other Ambulatory Visit: Payer: Self-pay

## 2019-05-28 ENCOUNTER — Ambulatory Visit (INDEPENDENT_AMBULATORY_CARE_PROVIDER_SITE_OTHER): Payer: BC Managed Care – PPO

## 2019-05-28 ENCOUNTER — Encounter: Payer: Self-pay | Admitting: Physician Assistant

## 2019-05-28 VITALS — BP 124/80 | HR 84 | Temp 98.2°F | Ht 73.0 in | Wt 260.5 lb

## 2019-05-28 DIAGNOSIS — M79642 Pain in left hand: Secondary | ICD-10-CM

## 2019-05-28 DIAGNOSIS — S62611A Displaced fracture of proximal phalanx of left index finger, initial encounter for closed fracture: Secondary | ICD-10-CM | POA: Diagnosis not present

## 2019-05-28 DIAGNOSIS — S62605A Fracture of unspecified phalanx of left ring finger, initial encounter for closed fracture: Secondary | ICD-10-CM | POA: Diagnosis not present

## 2019-05-28 DIAGNOSIS — S62615A Displaced fracture of proximal phalanx of left ring finger, initial encounter for closed fracture: Secondary | ICD-10-CM

## 2019-05-28 DIAGNOSIS — S62609A Fracture of unspecified phalanx of unspecified finger, initial encounter for closed fracture: Secondary | ICD-10-CM

## 2019-05-28 MED ORDER — KETOROLAC TROMETHAMINE 60 MG/2ML IM SOLN
60.0000 mg | Freq: Once | INTRAMUSCULAR | Status: AC
  Administered 2019-05-28: 60 mg via INTRAMUSCULAR

## 2019-05-28 MED ORDER — HYDROCODONE-ACETAMINOPHEN 5-325 MG PO TABS: 1.0000 | ORAL_TABLET | Freq: Four times a day (QID) | ORAL | 0 refills | Status: DC | PRN

## 2019-05-28 NOTE — Telephone Encounter (Signed)
[facility] Radiology is calling, needing to give a report, requested for a returned call.

## 2019-05-28 NOTE — Telephone Encounter (Signed)
Reviewed By Dr.Parker

## 2019-05-28 NOTE — Telephone Encounter (Signed)
Chief Complaint Hand or Wrist Injury Reason for Call Symptomatic / Request for Health Information Initial Comment Caller states that her son has fallen and has broken his wrist. He is in intense pain. He tripped and fell across the room. His wrist is very swollen. They need to know what to do. His mother is calling for him because he can't answer with his wrist. Translation No Nurse Assessment Nurse: Earlene Plater, RN, Lupita Leash Date/Time [name] Time): 05/28/2019 7:04:41 AM Confirm and document reason for call. If symptomatic, describe symptoms. ---Caller states he fell this morning and has possibly left hand. Pain 8-10/10. He tripped and fell across the room. No fever, cough or SOB. Has the patient had close contact with a person known or suspected to have the novel coronavirus illness OR traveled / lives in area with major community spread (including international travel) in the last 14 days from the onset of symptoms? * If Asymptomatic, screen for exposure and travel within the last 14 days. ---No Does the patient have any new or worsening symptoms? ---Yes Will a triage be completed? ---Yes Related visit to physician within the last 2 weeks? ---No Does the PT have any chronic conditions? (i.e. diabetes, asthma, this includes High risk factors for pregnancy, etc.) ---Yes List chronic conditions. ---SVT Anxiety Depression Irregular Heartburn Is this a behavioral health or substance abuse call? ---No Guidelines Guideline Title Affirmed Question Affirmed Notes Nurse Date/Time (Eastern Time) Hand and Wrist Injury [1] Numbness (loss of sensation) of finger(s) AND [2] present now Jim Hatfield 05/28/2019 6:08:32 AM PLEASE NOTE: All timestamps contained within this report are represented as [place] Standard Time. CONFIDENTIALTY NOTICE: This fax transmission is intended only for the addressee. It contains information that is legally privileged, confidential or otherwise protected from use  or disclosure. If you are not the intended recipient, you are strictly prohibited from reviewing, disclosing, copying using or disseminating any of this information or taking any action in reliance on or regarding this information. If you have received this fax in error, please notify us immediately by telephone so that we can arrange for its return to [place]. Phone: [phone], Toll-Free: [phone], Fax: [phone] Page: 2 of 2 Call Id: [id number] Disp. Time [name] Time) Disposition Final User 05/28/2019 6:12:45 AM Go to ED Now Yes Earlene Plater, RN, Lupita Leash

## 2019-05-28 NOTE — Telephone Encounter (Signed)
Patient Jim Hatfield radiologist called regarding left hand x-ray best contact number [phone]

## 2019-05-28 NOTE — Telephone Encounter (Signed)
Patient was seen today in office .

## 2019-05-28 NOTE — Progress Notes (Signed)
Office Visit Note   Patient: Jim Hatfield           Date of Birth: 14-Dec-1970           MRN: [id number] Visit Date: 05/28/2019              Requested by: Ardith Dark, MD [address] [place],  [place] [address] PCP: Ardith Dark, MD   Assessment & Plan: Visit Diagnoses:  1. Closed fracture of multiple phalanges of digit of hand, initial encounter     Plan: We will place him in a ulnar gutter splint he is to keep this clean dry and intact.  Try to treat this conservatively.  Elevation encouraged.  Like to see him back in 1 week to obtain 3 views of the left hand.  Questions encouraged and answered.  He has hydrocodone for pain .   Follow-Up Instructions: Return in about 1 week (around 06/04/2019).   Orders:  No orders of the defined types were placed in this encounter.  No orders of the defined types were placed in this encounter.     Procedures: No procedures performed   Clinical Data: No additional findings.   Subjective: Chief Complaint  Patient presents with  . Left Hand - Pain, Fracture    HPI Mr. Szilagyi is a 49 year old male were seen for left hand index and ring finger fractures.  He reports that he stumbled backwards this morning and caught himself on his left hand.  No loss of consciousness no dizziness.  Seen his primary care physician's office where radiographs were obtained of the left hand.  Personally reviewed these films they show fractures involving the proximal aspect of the second fourth proximal phalanges.  Intra-articular fractures with minimal of the second phalangeal fragment.  No dislocation subluxation.  No other bony abnormalities. Review of Systems Negative for fevers, chills, shortness of breath, dizziness or loss of consciousness.  Objective: Vital Signs: There were no vitals taken for this visit.  Physical Exam Cardiovascular:     Pulses: Normal pulses.  Neurological:     Mental Status: He is alert and oriented to person, place,  and time.  Psychiatric:        Mood and Affect: Mood normal.     Ortho Exam Left hand ecchymosis involving the palm.  He has tenderness over the proximal portion of the index and ring finger maximally over the fourth finger phalangeal base.  Sensation grossly intact throughout the hand.  No gross deformity.  Slight edema.  No rashes skin lesions ulcerations.  Radial pulses are 2+ bilaterally and equal symmetric. Specialty Comments:  No specialty comments available.  Imaging: DG Hand Complete Left  Result Date: 05/28/2019 CLINICAL DATA:  Pain following injury EXAM: LEFT HAND - COMPLETE 3+ VIEW COMPARISON:  None. FINDINGS: Frontal, oblique, and lateral views obtained. There is an obliquely oriented fracture along the lateral aspect of the proximal most portion of the fourth proximal phalanx with fracture fragment extending into the fourth MCP joint. A somewhat similar appearing fracture is seen along the proximal most aspect of the lateral aspect of the second proximal phalanx with slight displacement of fracture fragments and fracture extending into the second MCP joint. No other fractures are evident. No dislocations. Joint spaces appear normal. No erosive change. IMPRESSION: Fractures of the proximal most aspects of the second and fourth proximal phalanges with fracture fragments extending into the respective MCP joints. Slight displacement is noted at the second avulse fragment. No other  fractures are evident. No dislocation. No appreciable arthropathic change. These results will be called to the ordering clinician or representative by the Radiologist Assistant, and communication documented in the PACS or [employer]. Electronically Signed   By: Lowella Grip III M.D.   On: 05/28/2019 08:32     PMFS History: Patient Active Problem List   Diagnosis Date Noted  . Obesity 11/08/2018  . Encounter for weight management 11/23/2017  . PAC (premature atrial contraction) 11/18/2017  .  Epigastric abdominal pain 10/25/2017  . Depression, major, single episode, moderate ([place]) 09/19/2017  . Skin lesion 05/02/2017  . Chronic low back pain 03/29/2017  . Hypercholesteremia 10/25/2016  . Panic disorder 10/22/2016  . Nicotine dependence with current use 10/22/2016   Past Medical History:  Diagnosis Date  . Anxiety   . Cardiac arrhythmia due to congenital heart disease   . Depression   . Diverticulitis   . Diverticulitis   . SVT (supraventricular tachycardia) (HCC)     Family History  Problem Relation Age of Onset  . Cancer Mother        Breast  . Hypertension Mother   . Cancer Father 19       Pancreatic   . Heart attack Father   . Hyperlipidemia Father   . Hypertension Father   . Heart disease Father   . Depression Sister   . Depression Brother     Past Surgical History:  Procedure Laterality Date  . FOOT SURGERY Left    Social History   Occupational History  . Not on file  Tobacco Use  . Smoking status: Current Every Day Smoker    Packs/day: 0.50  . Smokeless tobacco: Never Used  Substance and Sexual Activity  . Alcohol use: Yes    Comment: socially  . Drug use: No  . Sexual activity: Yes

## 2019-05-28 NOTE — Patient Instructions (Signed)
It was very nice to see you today!  You broke your index finger and ring finger.  Please use ice as much as possible.  We will place you in a splint today.  We will also place a referral for you to see the hand specialist.  We will give you an injection of toradol today.   Please use hydrocodone as needed.  Let us know if your pain significantly worsens.  Take care, Dr [name]  Please try these tips to maintain a healthy lifestyle:   Eat at least 3 REAL meals and 1-2 snacks per day.  Aim for no more than 5 hours between eating.  If you eat breakfast, please do so within one hour of getting up.    Each meal should contain half fruits/vegetables, one quarter protein, and one quarter carbs (no bigger than a computer mouse)   Cut down on sweet beverages. This includes juice, soda, and sweet tea.     Drink at least 1 glass of water with each meal and aim for at least 8 glasses per day   Exercise at least 150 minutes every week.

## 2019-05-28 NOTE — Progress Notes (Signed)
   Jim Hatfield is a 49 y.o. male who presents today for an office visit.  Assessment/Plan:  New/Acute Problems: Proximal phalanx fractures.  Patient with proximal fractures of second and fourth phalanx involving MCP joint based on my read.  We will await radiology read.  Likely has wrist pain as well.  No red flags and has good neurovascular function distally.  Given that fracture fragments are displaced and in the joint space, will place referral to hand surgery for further evaluation/management.  Will buddy tape third through fourth digits and placing her splint today.  Advised him to use ice as much as possible.  Will give 60 mg of Toradol today.  Also send in small supply of hydrocodone for pain control.  Discussed reasons return to care.     Subjective:  HPI:  Patient here with left hand pain.  Patient states that he fell about 2 hours before coming into clinic.  States that he woke up and stumbled.  Fell backwards onto his left hand.  Immediately noticed pain and swelling to the area.  Has limited range of motion in fingers and wrist.  Pain is worse with movement.        Objective:  Physical Exam: BP 124/80   Pulse 84   Temp 98.2 F (36.8 C)   Ht 6\' 1"  (1.854 m)   Wt 260 lb 8 oz (118.2 kg)   SpO2 96%   BMI 34.37 kg/m   Gen: No acute distress, resting comfortably MSK: Left hand grossly edematous.  No pain at left shoulder or left elbow.  Mild pain with palpation along dorsal aspect of wrist.  No snuffbox tenderness.  Pain with palpation most significantly at second and fourth MCP joints.  Neurovascular intact distally.  Good distal cap refill.      . Katina Degree, MD 05/28/2019 8:25 AM

## 2019-06-04 ENCOUNTER — Encounter: Payer: Self-pay | Admitting: Physician Assistant

## 2019-06-04 ENCOUNTER — Other Ambulatory Visit: Payer: Self-pay

## 2019-06-04 ENCOUNTER — Ambulatory Visit: Payer: Self-pay

## 2019-06-04 ENCOUNTER — Ambulatory Visit: Payer: BC Managed Care – PPO | Admitting: Physician Assistant

## 2019-06-04 DIAGNOSIS — S62607A Fracture of unspecified phalanx of left little finger, initial encounter for closed fracture: Secondary | ICD-10-CM | POA: Diagnosis not present

## 2019-06-04 DIAGNOSIS — S62609A Fracture of unspecified phalanx of unspecified finger, initial encounter for closed fracture: Secondary | ICD-10-CM

## 2019-06-04 NOTE — Progress Notes (Signed)
Office Visit Note   Patient: Jim Hatfield           Date of Birth: 06-30-1970           MRN: [id number] Visit Date: 06/04/2019              Requested by: Vivi Barrack, MD [address] [place],  [place] [address] PCP: Vivi Barrack, MD   Assessment & Plan: Visit Diagnoses:  1. Closed fracture of multiple phalanges of digit of hand, initial encounter     Plan: We will keep him in the ulnar gutter splint.  He is to keep this clean dry intact.  He will follow-up with [place] in 3 weeks for repeat radiographs of the hand.  No heavy lifting on hand.  Questions were encouraged and answered.  Follow-Up Instructions: Return in about 3 weeks (around 06/25/2019) for Radiographs.   Orders:  Orders Placed This Encounter  Procedures  . XR Hand Complete Left   No orders of the defined types were placed in this encounter.     Procedures: No procedures performed   Clinical Data: No additional findings.   Subjective: Chief Complaint  Patient presents with  . Left Hand - Fracture, Follow-up    HPI Jim Hatfield returns today 1 week status post left hand closed fractures of the second and fourth proximal phalanges that extended into the metacarpophalangeal joint.  Patient is been in all gutter splint.  He notes swelling.  States pain is decreasing. Review of Systems Please see HPI otherwise negative  Objective: Vital Signs: There were no vitals taken for this visit.  Physical Exam General: Well-developed well-nourished male in no acute distress Ortho Exam Left hand: No impending rashes skin lesions ulcerations.  Positive edema.  Tenderness over the second and fourth metacarpal phalangeal joint region.  No obvious deformity.  Hand grossly neurovascular intact. Specialty Comments:  No specialty comments available.  Imaging: XR Hand Complete Left  Result Date: 06/04/2019 3 views left hand: Second and fourth proximal metacarpal phalangeal remain unchanged in position alignment.  No  other fractures identified.  No subluxation dislocation.    PMFS History: Patient Active Problem List   Diagnosis Date Noted  . Obesity 11/08/2018  . Encounter for weight management 11/23/2017  . PAC (premature atrial contraction) 11/18/2017  . Epigastric abdominal pain 10/25/2017  . Depression, major, single episode, moderate ([place]) 09/19/2017  . Skin lesion 05/02/2017  . Chronic low back pain 03/29/2017  . Hypercholesteremia 10/25/2016  . Panic disorder 10/22/2016  . Nicotine dependence with current use 10/22/2016   Past Medical History:  Diagnosis Date  . Anxiety   . Cardiac arrhythmia due to congenital heart disease   . Depression   . Diverticulitis   . Diverticulitis   . SVT (supraventricular tachycardia) (HCC)     Family History  Problem Relation Age of Onset  . Cancer Mother        Breast  . Hypertension Mother   . Cancer Father 71       Pancreatic   . Heart attack Father   . Hyperlipidemia Father   . Hypertension Father   . Heart disease Father   . Depression Sister   . Depression Brother     Past Surgical History:  Procedure Laterality Date  . FOOT SURGERY Left    Social History   Occupational History  . Not on file  Tobacco Use  . Smoking status: Current Every Day Smoker    Packs/day: 0.50  .  Smokeless tobacco: Never Used  Substance and Sexual Activity  . Alcohol use: Yes    Comment: socially  . Drug use: No  . Sexual activity: Yes

## 2019-06-07 ENCOUNTER — Ambulatory Visit: Payer: BC Managed Care – PPO | Attending: Internal Medicine

## 2019-06-07 DIAGNOSIS — Z23 Encounter for immunization: Secondary | ICD-10-CM

## 2019-06-07 NOTE — Progress Notes (Signed)
   Covid-19 Vaccination Clinic  Name:  Jim Hatfield    MRN: [id number] DOB: 22-Jan-1971  06/07/2019  Jim Hatfield was observed post Covid-19 immunization for 15 minutes without incident. He was provided with Vaccine Information Sheet and instruction to access the V-Safe system.   Jim Hatfield was instructed to call 911 with any severe reactions post vaccine: Marland Kitchen Difficulty breathing  . Swelling of face and throat  . A fast heartbeat  . A bad rash all over body  . Dizziness and weakness   Immunizations Administered    Name Date Dose VIS Date Route   Pfizer COVID-19 Vaccine 06/07/2019  9:40 AM 0.3 mL 02/23/2019 Intramuscular   Manufacturer: [employer], [employer]   Lot: [id number]   NDC: [address]-5

## 2019-06-25 ENCOUNTER — Ambulatory Visit: Payer: BC Managed Care – PPO | Admitting: Physician Assistant

## 2019-06-25 ENCOUNTER — Encounter: Payer: Self-pay | Admitting: Physician Assistant

## 2019-06-25 ENCOUNTER — Other Ambulatory Visit: Payer: Self-pay

## 2019-06-25 ENCOUNTER — Ambulatory Visit (INDEPENDENT_AMBULATORY_CARE_PROVIDER_SITE_OTHER): Payer: BC Managed Care – PPO

## 2019-06-25 DIAGNOSIS — S62609A Fracture of unspecified phalanx of unspecified finger, initial encounter for closed fracture: Secondary | ICD-10-CM | POA: Insufficient documentation

## 2019-06-25 DIAGNOSIS — S62605D Fracture of unspecified phalanx of left ring finger, subsequent encounter for fracture with routine healing: Secondary | ICD-10-CM | POA: Diagnosis not present

## 2019-06-25 DIAGNOSIS — S62609D Fracture of unspecified phalanx of unspecified finger, subsequent encounter for fracture with routine healing: Secondary | ICD-10-CM

## 2019-06-25 NOTE — Progress Notes (Signed)
Office Visit Note   Patient: Jim Hatfield           Date of Birth: Jun 02, 1970           MRN: [id number] Visit Date: 06/25/2019              Requested by: Vivi Barrack, MD [address] [place],  [place] [address] PCP: Vivi Barrack, MD   Assessment & Plan: Visit Diagnoses:  1. Closed fracture of multiple phalanges of digit of hand with routine healing, subsequent encounter     Plan: He will work on gentle range of motion of the hand, wrist, forearm and elbow.  No heavy lifting no gripping with the left hand.  Follow-up with [place] in 1 month for repeat x-rays.  Questions were encouraged and answered.  Discussed with him that as the swelling resolves the numbness at the tips of the fingers should also resolve.  Follow-Up Instructions: Return in about 4 weeks (around 07/23/2019) for Radiographs.   Orders:  Orders Placed This Encounter  Procedures  . XR Hand Complete Left   No orders of the defined types were placed in this encounter.     Procedures: No procedures performed   Clinical Data: No additional findings.   Subjective: Chief Complaint  Patient presents with  . Left Hand - Follow-up    HPI Jim Hatfield returns today follow-up of his left hand he is now a month status post fracture of the left fourth proximal phalanx.  He is overall doing well states he is some stiffness in the hand and some numbness in the fingers to the touch.  He has been in an ulnar gutter splint. Review of Systems See HPI  Objective: Vital Signs: There were no vitals taken for this visit.  Physical Exam Constitutional:      Appearance: He is not ill-appearing or diaphoretic.  Neurological:     Mental Status: He is alert and oriented to person, place, and time.  Psychiatric:        Mood and Affect: Mood normal.     Ortho Exam Left forearm atrophy compared to the right.  Full range of motion elbow in full supination pronation left forearm.  Radial pulse on the left is intact.   Subjective decreased sensation at the tips of the fourth and fifth fingers.  He has tenderness over the base of the left fourth proximal phalanx.  No gross deformities.  Slight edema throughout the hand. Specialty Comments:  No specialty comments available.  Imaging: XR Hand Complete Left  Result Date: 06/25/2019 Left hand 3 views: Base of fourth proximal phalanx fracture shows signs of consolidation.  Fracture remains in excellent position alignment.    PMFS History: Patient Active Problem List   Diagnosis Date Noted  . Closed fracture of multiple sites of phalanges of hand 06/25/2019  . Obesity 11/08/2018  . Encounter for weight management 11/23/2017  . PAC (premature atrial contraction) 11/18/2017  . Epigastric abdominal pain 10/25/2017  . Depression, major, single episode, moderate ([place]) 09/19/2017  . Skin lesion 05/02/2017  . Chronic low back pain 03/29/2017  . Hypercholesteremia 10/25/2016  . Panic disorder 10/22/2016  . Nicotine dependence with current use 10/22/2016   Past Medical History:  Diagnosis Date  . Anxiety   . Cardiac arrhythmia due to congenital heart disease   . Depression   . Diverticulitis   . Diverticulitis   . SVT (supraventricular tachycardia) ([place])     Family History  Problem Relation Age  of Onset  . Cancer Mother        Breast  . Hypertension Mother   . Cancer Father 58       Pancreatic   . Heart attack Father   . Hyperlipidemia Father   . Hypertension Father   . Heart disease Father   . Depression Sister   . Depression Brother     Past Surgical History:  Procedure Laterality Date  . FOOT SURGERY Left    Social History   Occupational History  . Not on file  Tobacco Use  . Smoking status: Current Every Day Smoker    Packs/day: 0.50  . Smokeless tobacco: Never Used  Substance and Sexual Activity  . Alcohol use: Yes    Comment: socially  . Drug use: No  . Sexual activity: Yes

## 2019-07-03 ENCOUNTER — Ambulatory Visit: Payer: BC Managed Care – PPO | Attending: Internal Medicine

## 2019-07-03 DIAGNOSIS — Z23 Encounter for immunization: Secondary | ICD-10-CM

## 2019-07-03 NOTE — Progress Notes (Signed)
   Covid-19 Vaccination Clinic  Name:  Jim Hatfield    MRN: [id number] DOB: 11/17/1970  07/03/2019  [name] was observed post Covid-19 immunization for 15 minutes without incident. He was provided with Vaccine Information Sheet and instruction to access the V-Safe system.   [name] was instructed to call 911 with any severe reactions post vaccine: Marland Kitchen Difficulty breathing  . Swelling of face and throat  . A fast heartbeat  . A bad rash all over body  . Dizziness and weakness   Immunizations Administered    Name Date Dose VIS Date Route   Pfizer COVID-19 Vaccine 07/03/2019  8:03 AM 0.3 mL 05/09/2018 Intramuscular   Manufacturer: [employer], [employer]   Lot: [id number]   NDC: [address]-1

## 2019-09-04 ENCOUNTER — Ambulatory Visit (INDEPENDENT_AMBULATORY_CARE_PROVIDER_SITE_OTHER): Payer: BC Managed Care – PPO | Admitting: Psychology

## 2019-09-04 DIAGNOSIS — F411 Generalized anxiety disorder: Secondary | ICD-10-CM

## 2019-09-06 IMAGING — DX DG LUMBAR SPINE COMPLETE 4+V
5 series · 5 of 5 positions shown · non-contrast
Comparison: Prior CT scan of the abdomen and pelvis 03/01/2013

CLINICAL DATA: 46-year-old male with chronic lumbar spine pain

EXAM:
LUMBAR SPINE - COMPLETE 4+ VIEW

[lumbar spine ap]
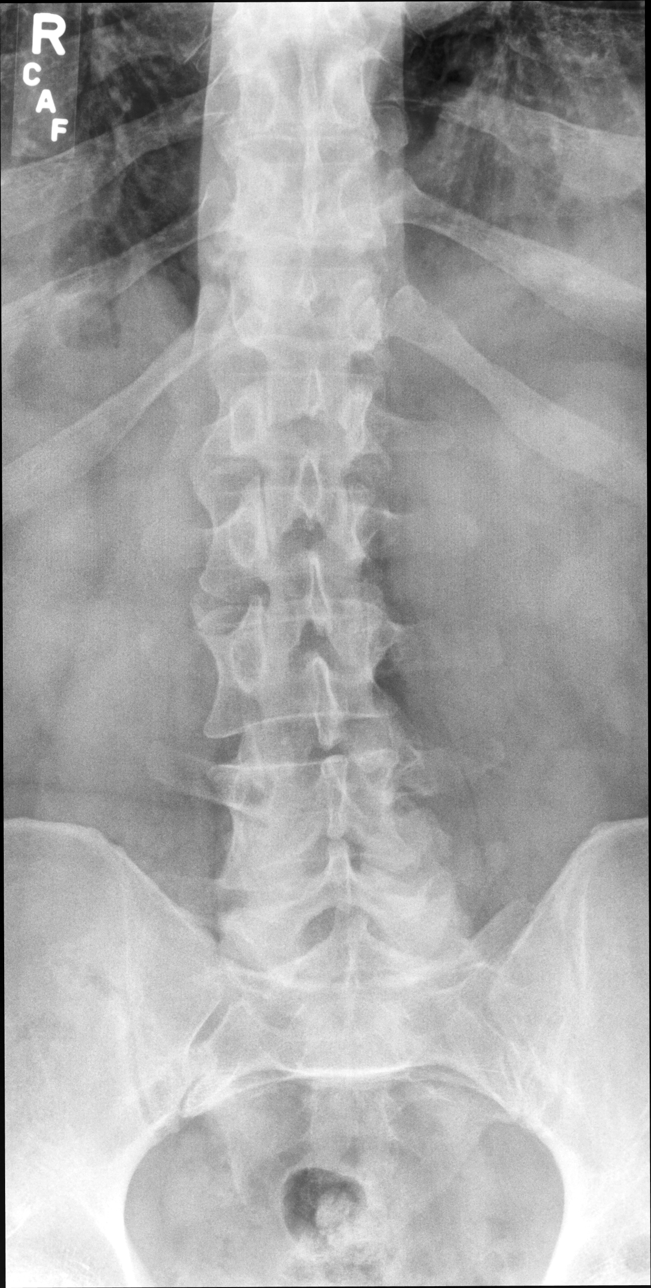

[lumbar spine oblique (1 of 2)]
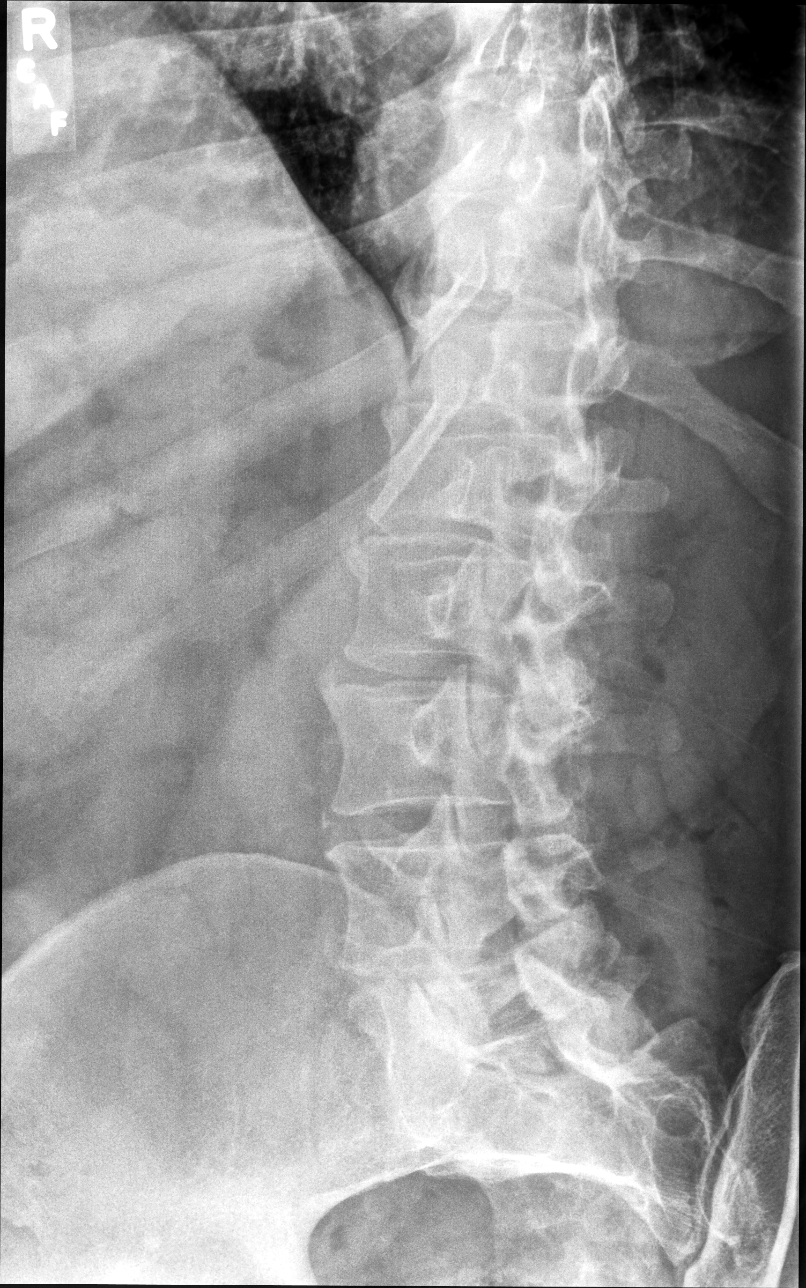

[lumbar spine oblique (2 of 2)]
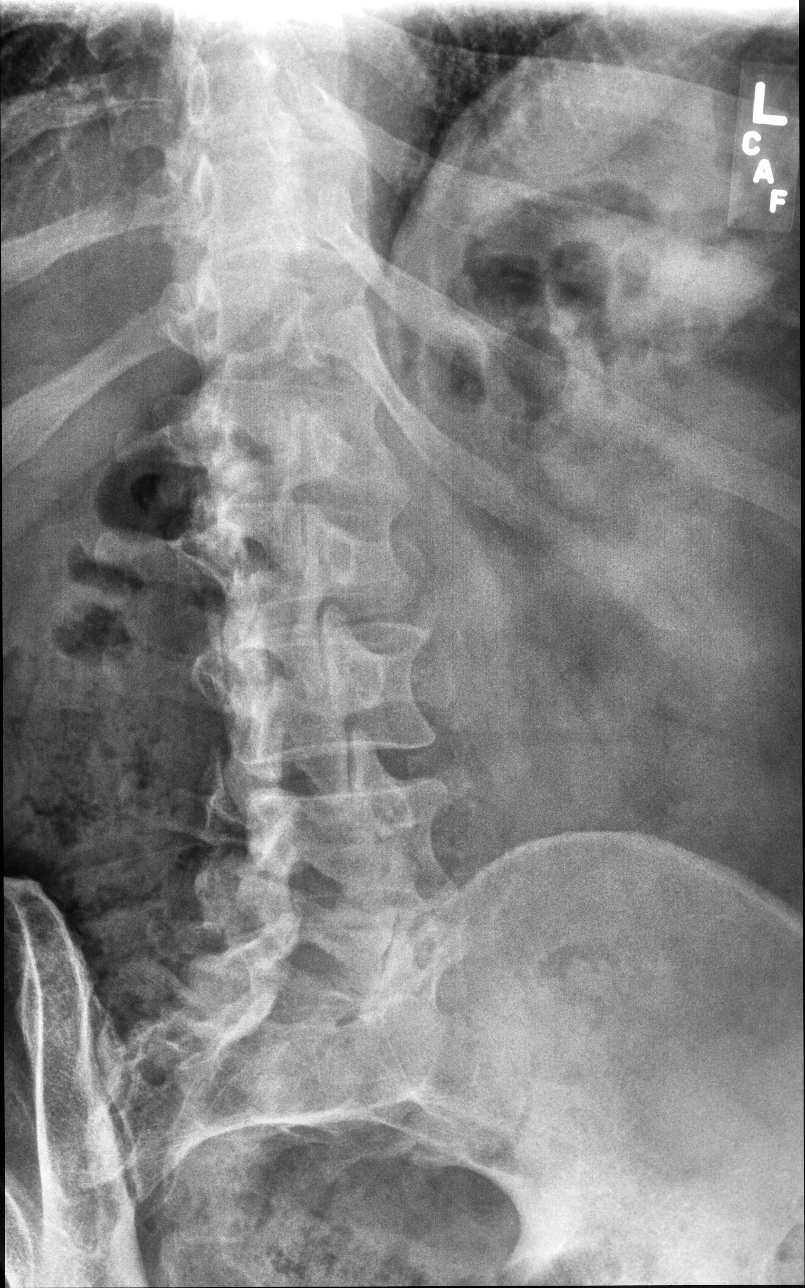

[lumbar spine lat (1 of 2)]
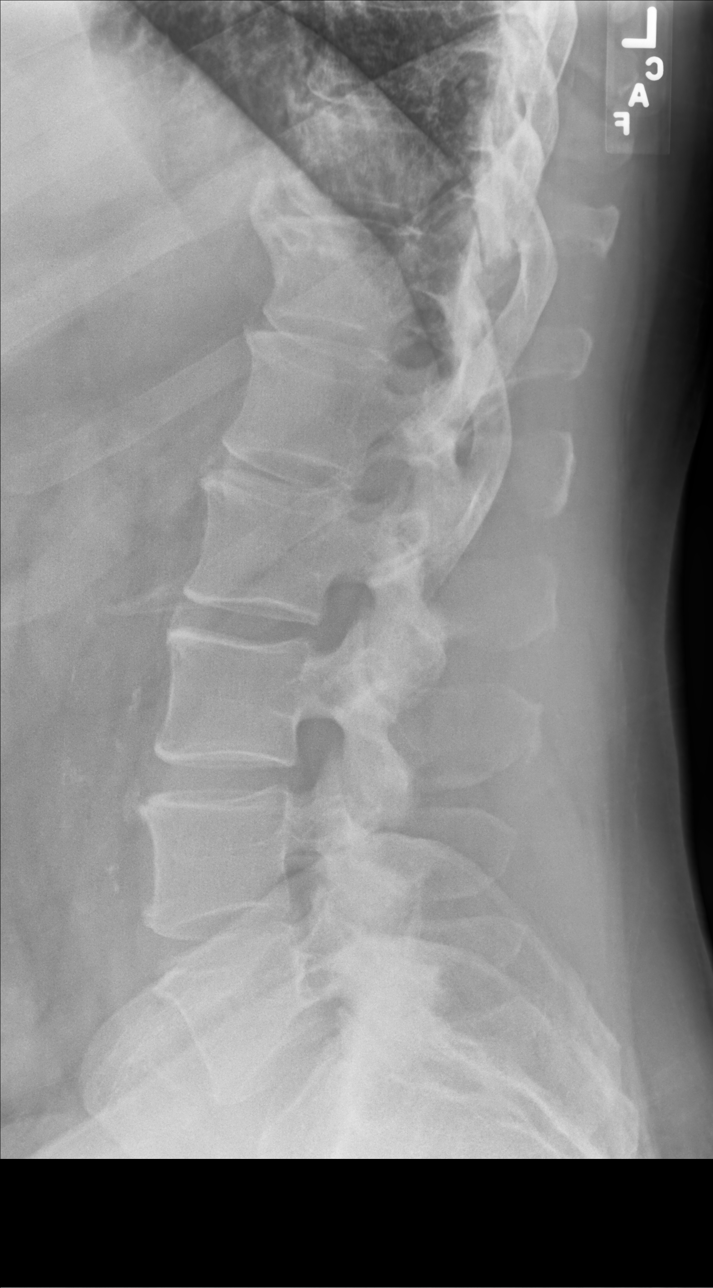

[lumbar spine lat (2 of 2)]
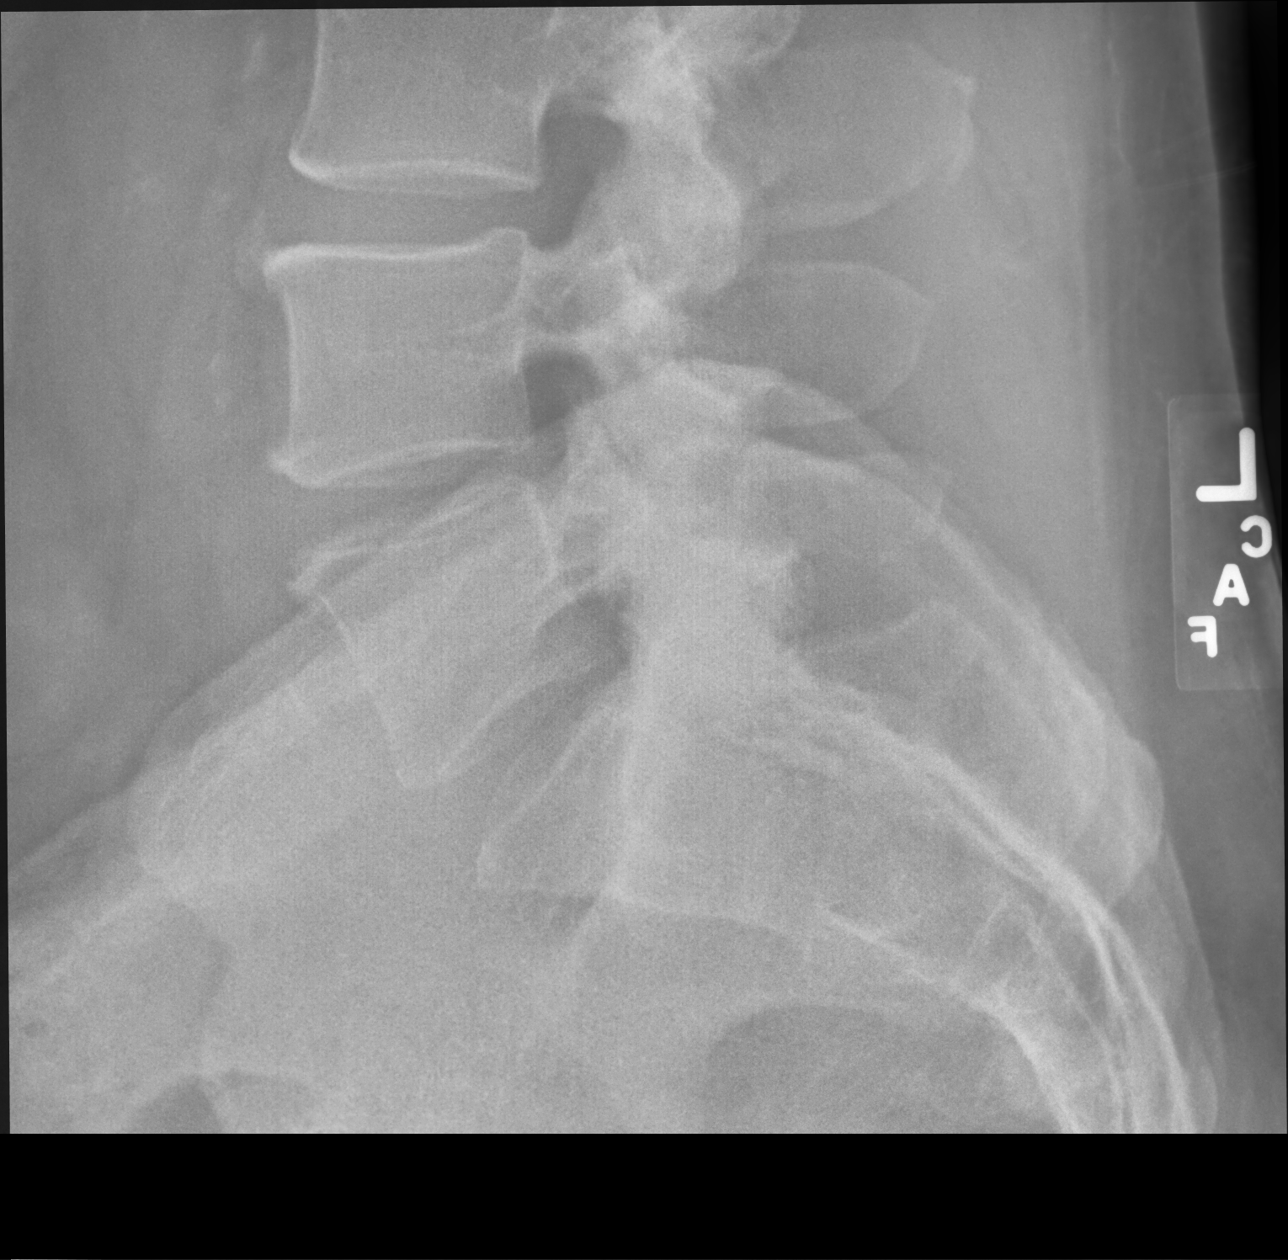

[5 of 5 positions shown; findings below may reference images not displayed]

FINDINGS: There is no evidence of lumbar spine fracture. Alignment is normal.
Intervertebral disc spaces are maintained. Facet hypertrophy is
present at L4-L5 and L5-S1. Trace atherosclerotic calcifications
present in the abdominal aorta. No aneurysm.
IMPRESSION: 1. Lower lumbar facet hypertrophy suggests underlying facet
arthropathy.
2. No conventional x-ray evidence of significant degenerative disc
disease.
3.  Aortic Atherosclerosis ([id number]-170.0)

## 2019-09-19 ENCOUNTER — Ambulatory Visit (INDEPENDENT_AMBULATORY_CARE_PROVIDER_SITE_OTHER): Payer: BC Managed Care – PPO | Admitting: Psychology

## 2019-09-19 DIAGNOSIS — F411 Generalized anxiety disorder: Secondary | ICD-10-CM | POA: Diagnosis not present

## 2019-10-03 ENCOUNTER — Ambulatory Visit (INDEPENDENT_AMBULATORY_CARE_PROVIDER_SITE_OTHER): Payer: BC Managed Care – PPO | Admitting: Psychology

## 2019-10-03 DIAGNOSIS — F411 Generalized anxiety disorder: Secondary | ICD-10-CM

## 2019-10-09 ENCOUNTER — Ambulatory Visit: Payer: Self-pay | Admitting: Psychology

## 2019-10-16 ENCOUNTER — Ambulatory Visit (INDEPENDENT_AMBULATORY_CARE_PROVIDER_SITE_OTHER): Payer: BC Managed Care – PPO | Admitting: Psychology

## 2019-10-16 DIAGNOSIS — F411 Generalized anxiety disorder: Secondary | ICD-10-CM

## 2019-10-31 ENCOUNTER — Ambulatory Visit (INDEPENDENT_AMBULATORY_CARE_PROVIDER_SITE_OTHER): Payer: BC Managed Care – PPO | Admitting: Psychology

## 2019-10-31 DIAGNOSIS — F411 Generalized anxiety disorder: Secondary | ICD-10-CM | POA: Diagnosis not present

## 2019-11-23 ENCOUNTER — Ambulatory Visit (INDEPENDENT_AMBULATORY_CARE_PROVIDER_SITE_OTHER): Payer: BC Managed Care – PPO | Admitting: Psychology

## 2019-11-23 DIAGNOSIS — F411 Generalized anxiety disorder: Secondary | ICD-10-CM | POA: Diagnosis not present

## 2019-12-11 ENCOUNTER — Telehealth: Payer: Self-pay

## 2019-12-11 NOTE — Telephone Encounter (Signed)
error 

## 2019-12-17 ENCOUNTER — Ambulatory Visit (INDEPENDENT_AMBULATORY_CARE_PROVIDER_SITE_OTHER): Payer: BC Managed Care – PPO | Admitting: Psychology

## 2019-12-17 DIAGNOSIS — F411 Generalized anxiety disorder: Secondary | ICD-10-CM

## 2020-01-24 ENCOUNTER — Ambulatory Visit (INDEPENDENT_AMBULATORY_CARE_PROVIDER_SITE_OTHER): Payer: BC Managed Care – PPO | Admitting: Psychology

## 2020-01-24 DIAGNOSIS — F411 Generalized anxiety disorder: Secondary | ICD-10-CM | POA: Diagnosis not present

## 2020-03-05 ENCOUNTER — Ambulatory Visit (INDEPENDENT_AMBULATORY_CARE_PROVIDER_SITE_OTHER): Payer: BC Managed Care – PPO | Admitting: Psychology

## 2020-03-05 DIAGNOSIS — F411 Generalized anxiety disorder: Secondary | ICD-10-CM

## 2020-04-13 ENCOUNTER — Other Ambulatory Visit: Payer: Self-pay | Admitting: Family Medicine

## 2020-04-16 ENCOUNTER — Encounter: Payer: Self-pay | Admitting: Family Medicine

## 2020-04-16 ENCOUNTER — Ambulatory Visit: Payer: BC Managed Care – PPO | Admitting: Family Medicine

## 2020-04-16 ENCOUNTER — Other Ambulatory Visit: Payer: Self-pay

## 2020-04-16 VITALS — BP 127/81 | HR 66 | Temp 97.3°F | Ht 73.0 in | Wt 263.2 lb

## 2020-04-16 DIAGNOSIS — F17210 Nicotine dependence, cigarettes, uncomplicated: Secondary | ICD-10-CM

## 2020-04-16 DIAGNOSIS — F410 Panic disorder [episodic paroxysmal anxiety] without agoraphobia: Secondary | ICD-10-CM | POA: Diagnosis not present

## 2020-04-16 DIAGNOSIS — F17200 Nicotine dependence, unspecified, uncomplicated: Secondary | ICD-10-CM | POA: Diagnosis not present

## 2020-04-16 DIAGNOSIS — F321 Major depressive disorder, single episode, moderate: Secondary | ICD-10-CM

## 2020-04-16 DIAGNOSIS — Z23 Encounter for immunization: Secondary | ICD-10-CM | POA: Diagnosis not present

## 2020-04-16 DIAGNOSIS — L732 Hidradenitis suppurativa: Secondary | ICD-10-CM | POA: Diagnosis not present

## 2020-04-16 DIAGNOSIS — Z1211 Encounter for screening for malignant neoplasm of colon: Secondary | ICD-10-CM

## 2020-04-16 MED ORDER — CLINDAMYCIN PHOSPHATE 1 % EX SOLN: Freq: Two times a day (BID) | CUTANEOUS | 0 refills | Status: DC

## 2020-04-16 MED ORDER — DIAZEPAM 10 MG PO TABS: ORAL_TABLET | ORAL | 1 refills | Status: DC

## 2020-04-16 NOTE — Assessment & Plan Note (Signed)
Symptoms currently mild.  He has been using topical salicylic acid with some benefit.  Recommend he continue this.  Will start topical clindamycin twice daily.  Also recommended Hibiclens once or twice weekly.  If not proving would consider trial of oral antibiotics or referral to dermatology/plastic surgery.

## 2020-04-16 NOTE — Assessment & Plan Note (Signed)
Patient was asked about his tobacco use today and was strongly advised to quit. Patient is currently contemplative. We reviewed treatment options to assist him quit smoking including NRT, Chantix, and Bupropion.  Patient is currently contemplative.  He has not had success with an RT, Chantix or Wellbutrin in the past.  He would like to try to quit cold [place].  Follow up at next office visit.   Total time spent counseling approximately 3 minutes.

## 2020-04-16 NOTE — Assessment & Plan Note (Signed)
Stable on Lexapro 20 mg daily. 

## 2020-04-16 NOTE — Patient Instructions (Signed)
It was very nice to see you today!  I will refill your Valium.  We will place an order for Cologuard.  We give you a flu shot today.  Please try the clindamycin gel for the affected areas.  You can continue using salicylic acid wash daily.  Try using Hibiclens once or twice per week.  Let me know if not improving.  I will see back in 6 to 12 months.  Please come back to see me sooner if needed.  Take care, Dr [name]  Please try these tips to maintain a healthy lifestyle:   Eat at least 3 REAL meals and 1-2 snacks per day.  Aim for no more than 5 hours between eating.  If you eat breakfast, please do so within one hour of getting up.    Each meal should contain half fruits/vegetables, one quarter protein, and one quarter carbs (no bigger than a computer mouse)   Cut down on sweet beverages. This includes juice, soda, and sweet tea.     Drink at least 1 glass of water with each meal and aim for at least 8 glasses per day   Exercise at least 150 minutes every week.

## 2020-04-16 NOTE — Assessment & Plan Note (Signed)
Stable.  Continue Lexapro 20 mg daily and Valium 5mg  every 6 hours as needed.

## 2020-04-16 NOTE — Progress Notes (Signed)
   Jim Hatfield is a 50 y.o. male who presents today for an office visit.  Assessment/Plan:  Chronic Problems Addressed Today: Hidradenitis Symptoms currently mild.  He has been using topical salicylic acid with some benefit.  Recommend he continue this.  Will start topical clindamycin twice daily.  Also recommended Hibiclens once or twice weekly.  If not proving would consider trial of oral antibiotics or referral to dermatology/plastic surgery.  Depression, major, single episode, moderate (HCC) Stable on Lexapro 20 mg daily.  Nicotine dependence with current use Patient was asked about his tobacco use today and was strongly advised to quit. Patient is currently contemplative. We reviewed treatment options to assist him quit smoking including NRT, Chantix, and Bupropion.  Patient is currently contemplative.  He has not had success with an RT, Chantix or Wellbutrin in the past.  He would like to try to quit cold [place].  Follow up at next office visit.   Total time spent counseling approximately 3 minutes.    Panic disorder Stable.  Continue Lexapro 20 mg daily and Valium 5mg  every 6 hours as needed.   Flu vaccine given today.     Subjective:  HPI:  See A/p.         Objective:  Physical Exam: BP 127/81   Pulse 66   Temp (!) 97.3 F (36.3 C) (Temporal)   Ht 6\' 1"  (1.854 m)   Wt 263 lb 3.2 oz (119.4 kg)   SpO2 98%   BMI 34.73 kg/m   Wt Readings from Last 3 Encounters:  04/16/20 263 lb 3.2 oz (119.4 kg)  05/28/19 260 lb 8 oz (118.2 kg)  10/25/18 268 lb (121.6 kg)    Gen: No acute distress, resting comfortably CV: Regular rate and rhythm with no murmurs appreciated Pulm: Normal work of breathing, clear to auscultation bilaterally with no crackles, wheezes, or rhonchi Skin: Hidradenitis on posterior neck Neuro: Grossly normal, moves all extremities Psych: Normal affect and thought content      Jim Hatfield M. 05/30/19, MD 04/16/2020 10:20 AM

## 2020-05-06 ENCOUNTER — Ambulatory Visit (INDEPENDENT_AMBULATORY_CARE_PROVIDER_SITE_OTHER): Payer: BC Managed Care – PPO | Admitting: Psychology

## 2020-05-06 DIAGNOSIS — F411 Generalized anxiety disorder: Secondary | ICD-10-CM | POA: Diagnosis not present

## 2020-05-11 ENCOUNTER — Other Ambulatory Visit: Payer: Self-pay | Admitting: Family Medicine

## 2020-06-11 ENCOUNTER — Ambulatory Visit (INDEPENDENT_AMBULATORY_CARE_PROVIDER_SITE_OTHER): Payer: BC Managed Care – PPO | Admitting: Psychology

## 2020-06-11 DIAGNOSIS — F411 Generalized anxiety disorder: Secondary | ICD-10-CM

## 2020-06-15 ENCOUNTER — Other Ambulatory Visit: Payer: Self-pay | Admitting: Family Medicine

## 2021-01-09 ENCOUNTER — Telehealth: Payer: BC Managed Care – PPO | Admitting: Family Medicine

## 2021-01-09 DIAGNOSIS — L732 Hidradenitis suppurativa: Secondary | ICD-10-CM

## 2021-01-09 DIAGNOSIS — F321 Major depressive disorder, single episode, moderate: Secondary | ICD-10-CM | POA: Diagnosis not present

## 2021-01-09 DIAGNOSIS — I491 Atrial premature depolarization: Secondary | ICD-10-CM

## 2021-01-09 DIAGNOSIS — F410 Panic disorder [episodic paroxysmal anxiety] without agoraphobia: Secondary | ICD-10-CM

## 2021-01-09 MED ORDER — ESCITALOPRAM OXALATE 10 MG PO TABS: 20.0000 mg | ORAL_TABLET | Freq: Every day | ORAL | 3 refills | Status: DC

## 2021-01-09 MED ORDER — DIAZEPAM 10 MG PO TABS: ORAL_TABLET | ORAL | 1 refills | Status: DC

## 2021-01-09 MED ORDER — PROPRANOLOL HCL ER 120 MG PO CP24: 120.0000 mg | ORAL_CAPSULE | Freq: Every day | ORAL | 3 refills | Status: DC

## 2021-01-09 NOTE — Progress Notes (Signed)
   Jim Hatfield is a 50 y.o. male who presents today for a virtual office visit.  Assessment/Plan:  Chronic Problems Addressed Today: Hidradenitis Has had several flares recently.  We have been using topical clindamycin without improvement.  Given continued issues will place referral to plastic surgery for further evaluation.  PAC (premature atrial contraction) Stable.  Refill propranolol 120 mg daily.  Depression, major, single episode, moderate (HCC) Doing well on Lexapro 20 mg daily.  Refill today.  Can consider weaning off at some point in the future if he wishes.  Panic disorder Doing much better on Lexapro 20 mg daily.  We will also continue Valium 5 mg every 6 hours as needed.  Refill sent in today.  Follow-up in 6 months.     Subjective:  HPI:  See A/p for status of chronic conditions.         Objective/Observations  Physical Exam: Gen: NAD, resting comfortably Pulm: Normal work of breathing Neuro: Grossly normal, moves all extremities Psych: Normal affect and thought content  Virtual Visit via Video   I connected with Jim Hatfield on 01/09/21 at 11:20 AM EDT by a video enabled telemedicine application and verified that I am speaking with the correct person using two identifiers. The limitations of evaluation and management by telemedicine and the availability of in person appointments were discussed. The patient expressed understanding and agreed to proceed.   Patient location: Home Provider location: [place] [facility] [employer] [facility] Persons participating in the virtual visit: Myself and Patient     Katina Degree. [name], MD 01/09/2021 11:22 AM

## 2021-01-09 NOTE — Assessment & Plan Note (Signed)
Has had several flares recently.  We have been using topical clindamycin without improvement.  Given continued issues will place referral to plastic surgery for further evaluation.

## 2021-01-09 NOTE — Assessment & Plan Note (Signed)
Stable.  Refill propranolol 120 mg daily.

## 2021-01-09 NOTE — Assessment & Plan Note (Signed)
Doing well on Lexapro 20 mg daily.  Refill today.  Can consider weaning off at some point in the future if he wishes.

## 2021-01-09 NOTE — Assessment & Plan Note (Signed)
Doing much better on Lexapro 20 mg daily.  We will also continue Valium 5 mg every 6 hours as needed.  Refill sent in today.  Follow-up in 6 months.

## 2021-01-12 ENCOUNTER — Telehealth: Payer: Self-pay

## 2021-01-12 NOTE — Telephone Encounter (Signed)
[facility] called regarding a referral.  They are unable to do what Dr [name] wants Jim Hatfield to be seen for. She stated that they recommend going to an [place] setting like [facility] or [place]. Please Advise.

## 2021-01-15 NOTE — Telephone Encounter (Signed)
Need Dermatology referral?

## 2021-01-15 NOTE — Telephone Encounter (Signed)
Can we try referring to dermatology instead? Thanks! -CMP

## 2021-05-18 ENCOUNTER — Ambulatory Visit: Payer: BC Managed Care – PPO | Admitting: Family Medicine

## 2021-05-18 ENCOUNTER — Encounter: Payer: Self-pay | Admitting: Family Medicine

## 2021-05-18 ENCOUNTER — Other Ambulatory Visit: Payer: Self-pay

## 2021-05-18 VITALS — BP 144/88 | HR 64 | Temp 98.2°F | Ht 73.0 in | Wt 274.0 lb

## 2021-05-18 DIAGNOSIS — F321 Major depressive disorder, single episode, moderate: Secondary | ICD-10-CM

## 2021-05-18 DIAGNOSIS — E669 Obesity, unspecified: Secondary | ICD-10-CM | POA: Diagnosis not present

## 2021-05-18 DIAGNOSIS — F17200 Nicotine dependence, unspecified, uncomplicated: Secondary | ICD-10-CM

## 2021-05-18 DIAGNOSIS — Z23 Encounter for immunization: Secondary | ICD-10-CM

## 2021-05-18 DIAGNOSIS — F410 Panic disorder [episodic paroxysmal anxiety] without agoraphobia: Secondary | ICD-10-CM | POA: Diagnosis not present

## 2021-05-18 DIAGNOSIS — F17210 Nicotine dependence, cigarettes, uncomplicated: Secondary | ICD-10-CM | POA: Diagnosis not present

## 2021-05-18 NOTE — Assessment & Plan Note (Signed)
Patient was asked about his tobacco use today and was strongly advised to quit. Patient is currently contemplative. We reviewed treatment options to assist him quit smoking including NRT, Chantix, and Bupropion.  He does not wish to try any of these.  He would like to try cold [place].  Follow up at next office visit.  ? ?Total time spent counseling approximately 3 minutes.  ? ?

## 2021-05-18 NOTE — Assessment & Plan Note (Signed)
I had a lengthy discussion with patient today regarding his underlying anxiety and panic disorder.  His work form was completed today.  Overall he feels like his symptoms are manageable as long as he is able to stay within his "bubble."  He is able to achieve most activities of daily living.  He is very concerned about going back into the office.  We discussed medication changes today however he would like continue with tolterodine for now.  He may be possibly interested in reestablishing with a therapist but deferred for now.  He will send me a message in a few weeks let me know how he is doing. ?

## 2021-05-18 NOTE — Assessment & Plan Note (Signed)
Discussed lifestyle modifications.

## 2021-05-18 NOTE — Assessment & Plan Note (Signed)
Stable on Lexapro 20 mg daily. 

## 2021-05-18 NOTE — Patient Instructions (Signed)
It was very nice to see you today! ? ?We completed your form today. ? ?Please continue work on diet and exercise. ? ?Send me a message in a few weeks let me know how you are doing. ? ?Take care, ?Dr [name] ? ?PLEASE NOTE: ? ?If you had any lab tests please let us know if you have not heard back within a few days. You may see your results on mychart before we have a chance to review them but we will give you a call once they are reviewed by [place]. If we ordered any referrals today, please let us know if you have not heard from their office within the next week.  ? ?Please try these tips to maintain a healthy lifestyle: ? ?Eat at least 3 REAL meals and 1-2 snacks per day.  Aim for no more than 5 hours between eating.  If you eat breakfast, please do so within one hour of getting up.  ? ?Each meal should contain half fruits/vegetables, one quarter protein, and one quarter carbs (no bigger than a computer mouse) ? ?Cut down on sweet beverages. This includes juice, soda, and sweet tea.  ? ?Drink at least 1 glass of water with each meal and aim for at least 8 glasses per day ? ?Exercise at least 150 minutes every week.   ?

## 2021-05-18 NOTE — Progress Notes (Signed)
? ?  Jim Hatfield is a 51 y.o. male who presents today for an office visit. ? ?Assessment/Plan:  ?Chronic Problems Addressed Today: ?Panic disorder ?I had a lengthy discussion with patient today regarding his underlying anxiety and panic disorder.  His work form was completed today.  Overall he feels like his symptoms are manageable as long as he is able to stay within his "bubble."  He is able to achieve most activities of daily living.  He is very concerned about going back into the office.  We discussed medication changes today however he would like continue with tolterodine for now.  He may be possibly interested in reestablishing with a therapist but deferred for now.  He will send me a message in a few weeks let me know how he is doing. ? ?Obesity ?Discussed lifestyle modifications. ? ?Depression, major, single episode, moderate (HCC) ?Stable on Lexapro 20 mg daily. ? ?Nicotine dependence with current use ?Patient was asked about his tobacco use today and was strongly advised to quit. Patient is currently contemplative. We reviewed treatment options to assist him quit smoking including NRT, Chantix, and Bupropion.  He does not wish to try any of these.  He would like to try cold [place].  Follow up at next office visit.  ? ?Total time spent counseling approximately 3 minutes.  ? ? ?  ?Subjective:  ?HPI: ? ?Please see A/p for status of chronic conditions. He requests [place] to fill out a form for accomodation for him to work form home.  He has been working at home for several years. He wishes to stay at home to help with anxiety and panic disorder.  ? ?   ?  ?Objective:  ?Physical Exam: ?BP (!) 144/88 (BP Location: Right Arm)   Pulse 64   Temp 98.2 ?F (36.8 ?C) (Temporal)   Ht 6\' 1"  (1.854 m)   Wt 274 lb (124.3 kg)   SpO2 97%   BMI 36.15 kg/m?   ?Gen: No acute distress, resting comfortably ?Neuro: Grossly normal, moves all extremities ?Psych: Normal affect and thought content ? ?Time Spent: ?45 minutes of  total time was spent on the date of the encounter performing the following actions: chart review prior to seeing the patient, obtaining history, performing a medically necessary exam, completing his work accommodation form, counseling on the treatment plan, placing orders, and documenting in our EHR. This time does not include time spent discussing his smoking.  ? ? ?   ? ? . Katina Degree, MD ?05/18/2021 3:01 PM  ?

## 2021-05-20 ENCOUNTER — Encounter: Payer: Self-pay | Admitting: Family Medicine

## 2021-05-20 NOTE — Telephone Encounter (Signed)
See note

## 2021-05-21 NOTE — Telephone Encounter (Signed)
I am happy to addend to note if needed. Do we have a copy here to make an amendment? ? ?Katina Degree. [name], MD ?05/21/2021 9:30 AM  ? ?

## 2021-05-21 NOTE — Telephone Encounter (Signed)
The copy that was made we sent to scan, I can contact pt to get another copy. ?

## 2021-05-21 NOTE — Telephone Encounter (Signed)
Yes can we please have him bring the original back in so that we can amend it? ? ?Katina Degree. [name], MD ?05/21/2021 11:10 AM  ? ?

## 2021-05-21 NOTE — Telephone Encounter (Signed)
Spoke to pt, verified DOB pt dropping back off the original paperwork to be ammended and we will send directly to email left in MyChart msg for employer.  ?

## 2021-05-25 ENCOUNTER — Telehealth: Payer: Self-pay | Admitting: Family Medicine

## 2021-05-25 NOTE — Telephone Encounter (Signed)
Type of form received: ?Healthcare provider reccomendation ?Additional comments:  ? ?Received by: ?[place]  ? ?Form should be Faxed to: ?[phone] ? ?Form should be mailed to:   ? ?Is patient requesting call for pickup: ? ? ?Form placed:   ?In providers mailbox  ?Attach charge sheet.  ?Yes  ?Individual made aware of 3-5 business day turn around (Y/N)? ?Yes  ? ?

## 2021-05-28 NOTE — Telephone Encounter (Signed)
Forms received and advised pt would let him know as soon as they were completed. Please allow 7-10 business days to complete forms per office procedure.  ?

## 2021-05-29 NOTE — Telephone Encounter (Signed)
Pt called in to state the HR office still will not accept the paperwork due to being specific as to the limitations. He stated, on the form it list examples and this is what HR is looking for. He is asking for it to be completed again and faxed over again as soon as it can be done. ?

## 2021-05-29 NOTE — Telephone Encounter (Signed)
Form faxed to [phone] ?Copy placed in front office for patient to pick up, patient aware  ? ?

## 2021-06-01 NOTE — Telephone Encounter (Signed)
I can fill it out again. Do we have another copy? ? ?[name] M. [name], MD ?06/01/2021 8:32 AM  ? ?

## 2021-06-02 NOTE — Telephone Encounter (Signed)
The new copy was put in your office last Friday by Francena Hanly to be signed.  ?

## 2021-06-04 ENCOUNTER — Ambulatory Visit: Payer: BC Managed Care – PPO | Admitting: Family Medicine

## 2021-06-04 VITALS — BP 136/80 | HR 67 | Temp 98.2°F | Ht 73.0 in | Wt 268.8 lb

## 2021-06-04 DIAGNOSIS — F410 Panic disorder [episodic paroxysmal anxiety] without agoraphobia: Secondary | ICD-10-CM | POA: Diagnosis not present

## 2021-06-04 NOTE — Telephone Encounter (Signed)
Pt came in office this morning paperwork faxed and received fax confirmation.  ?

## 2021-06-04 NOTE — Patient Instructions (Signed)
It was very nice to see you today! ? ?We will complete your forms today and fax over.  Please let us know if we can help in any other way. ? ?Take care, ?Dr [name] ? ?PLEASE NOTE: ? ?If you had any lab tests please let us know if you have not heard back within a few days. You may see your results on mychart before we have a chance to review them but we will give you a call once they are reviewed by [place]. If we ordered any referrals today, please let us know if you have not heard from their office within the next week.  ? ?Please try these tips to maintain a healthy lifestyle: ? ?Eat at least 3 REAL meals and 1-2 snacks per day.  Aim for no more than 5 hours between eating.  If you eat breakfast, please do so within one hour of getting up.  ? ?Each meal should contain half fruits/vegetables, one quarter protein, and one quarter carbs (no bigger than a computer mouse) ? ?Cut down on sweet beverages. This includes juice, soda, and sweet tea.  ? ?Drink at least 1 glass of water with each meal and aim for at least 8 glasses per day ? ?Exercise at least 150 minutes every week.   ?

## 2021-06-04 NOTE — Progress Notes (Signed)
? ?  Jim Hatfield is a 51 y.o. male who presents today for an office visit. ? ?Assessment/Plan:  ?Chronic Problems Addressed Today: ?Panic disorder ?Stable.  Work accommodation form completed and faxed to his work office today.  He will continue current medication regimen. ? ? ?  ?Subjective:  ?HPI: ? ?Patient here for work accomodation form completion. He has been having issue with panic disorder and anxiety. He have some limitation that prevent him to working. He would like to stay at home. He is requesting to fill the form today. He has been doing well. He is currently on Lexapro 10 mg daily. He is tolerating his medication without any side effects. He has no other complaint today.  ? ?   ?  ?Objective:  ?Physical Exam: ?BP 136/80 (BP Location: Left Arm)   Pulse 67   Temp 98.2 ?F (36.8 ?C) (Temporal)   Ht 6\' 1"  (1.854 m)   Wt 268 lb 12.8 oz (121.9 kg)   SpO2 98%   BMI 35.46 kg/m?   ?Gen: No acute distress, resting comfortably ?Neuro: Grossly normal, moves all extremities ?Psych: Normal affect and thought content ? ?   ? ? ?I,Savera Zaman,acting as a for Neurosurgeon, MD.,have documented all relevant documentation on the behalf of Jacquiline Doe, MD,as directed by  Jacquiline Doe, MD while in the presence of Jacquiline Doe, MD.  ? ?I, Jacquiline Doe, MD, have reviewed all documentation for this visit. The documentation on 06/04/21 for the exam, diagnosis, procedures, and orders are all accurate and complete. ? ?06/06/21. Katina Degree, MD ?06/04/2021 10:53 AM  ? ?

## 2021-06-04 NOTE — Assessment & Plan Note (Signed)
Stable.  Work accommodation form completed and faxed to his work office today.  He will continue current medication regimen. ?

## 2021-06-15 ENCOUNTER — Encounter: Payer: Self-pay | Admitting: Family Medicine

## 2021-06-15 ENCOUNTER — Ambulatory Visit: Payer: BC Managed Care – PPO | Admitting: Family Medicine

## 2021-06-15 VITALS — BP 131/82 | HR 75 | Temp 97.7°F | Ht 73.0 in | Wt 266.8 lb

## 2021-06-15 DIAGNOSIS — L732 Hidradenitis suppurativa: Secondary | ICD-10-CM

## 2021-06-15 MED ORDER — DOXYCYCLINE HYCLATE 100 MG PO TABS: 100.0000 mg | ORAL_TABLET | Freq: Two times a day (BID) | ORAL | 0 refills | Status: DC

## 2021-06-15 MED ORDER — OXYCODONE-ACETAMINOPHEN 5-325 MG PO TABS: 1.0000 | ORAL_TABLET | Freq: Three times a day (TID) | ORAL | 0 refills | Status: AC | PRN

## 2021-06-15 NOTE — Assessment & Plan Note (Signed)
Flareup today with abscess.  I&D performed today.  See below procedure note.  He tolerated well.  We will start doxycycline.  We will give small supply of oxycodone to use as needed for pain. ? ?Given recurrent issues will place referral to plastic surgery for further management. ?

## 2021-06-15 NOTE — Progress Notes (Signed)
? ?  Jim Hatfield is a 51 y.o. male who presents today for an office visit. ? ?Assessment/Plan:  ?Chronic Problems Addressed Today: ?Hidradenitis ?Flareup today with abscess.  I&D performed today.  See below procedure note.  He tolerated well.  We will start doxycycline.  We will give small supply of oxycodone to use as needed for pain. ? ?Given recurrent issues will place referral to plastic surgery for further management. ? ? ?  ?Subjective:  ?HPI: ? ?Patient had a flare of his hidradenitis.  Located in right inner thigh.  Got a lot worse yesterday.  Has an abscess to the area.  No fevers or chills.  No nausea or vomiting.  No specific treatments tried. ? ?   ?  ?Objective:  ?Physical Exam: ?BP 131/82 (BP Location: Right Arm)   Pulse 75   Temp 97.7 ?F (36.5 ?C) (Temporal)   Ht 6\' 1"  (1.854 m)   Wt 266 lb 12.8 oz (121 kg)   SpO2 97%   BMI 35.20 kg/m?   ?Gen: No acute distress, resting comfortably ?Skin: Inflamed indurated area approximately 12 to 13 cm in diameter on right inner thigh with central area of fluctuance. ?Neuro: Grossly normal, moves all extremities ?Psych: Normal affect and thought content ? ?Incision and Drainage Procedure Note ? ?Pre-operative Diagnosis: Abscess  ? ?Post-operative Diagnosis: same ? ?Indications: Therapeutic ? ?Anesthesia: 2% plain lidocaine ? ?Procedure Details  ?The procedure, risks and complications have been discussed in detail (including, but not limited to airway compromise, infection, bleeding) with the patient, and the patient has signed consent to the procedure. ? ?The skin was sterilely prepped and draped over the affected area in the usual fashion. ?After adequate local anesthesia, I&D with a #11 blade was performed on the Abscess on his right thigh. Purulent drainage: present ?The patient was observed until stable. ? ?Findings: ?Abscess ? ?EBL: 7 cc's ? ?Drains: None ? ?Condition: Tolerated procedure well ? ? ?Complications: ?none. ? ? ? ?   ? ? . Katina Degree,  MD ?06/15/2021 12:30 PM  ?

## 2021-06-30 ENCOUNTER — Other Ambulatory Visit: Payer: Self-pay | Admitting: *Deleted

## 2021-06-30 DIAGNOSIS — L732 Hidradenitis suppurativa: Secondary | ICD-10-CM

## 2021-09-26 ENCOUNTER — Other Ambulatory Visit: Payer: Self-pay | Admitting: Family Medicine

## 2021-10-19 ENCOUNTER — Other Ambulatory Visit: Payer: Self-pay | Admitting: Surgery

## 2021-11-03 ENCOUNTER — Encounter: Payer: Self-pay | Admitting: Family Medicine

## 2021-11-03 NOTE — Telephone Encounter (Signed)
Please advise 

## 2021-11-05 NOTE — Telephone Encounter (Signed)
Ok to place referral.  Katina Degree. [name], MD 11/05/2021 12:36 PM

## 2021-11-13 ENCOUNTER — Other Ambulatory Visit: Payer: Self-pay | Admitting: *Deleted

## 2021-11-13 DIAGNOSIS — F410 Panic disorder [episodic paroxysmal anxiety] without agoraphobia: Secondary | ICD-10-CM

## 2021-11-13 NOTE — Telephone Encounter (Signed)
Referral placed.

## 2021-12-07 ENCOUNTER — Encounter: Payer: Self-pay | Admitting: *Deleted

## 2022-02-25 ENCOUNTER — Encounter: Payer: Self-pay | Admitting: *Deleted

## 2022-03-25 ENCOUNTER — Other Ambulatory Visit: Payer: Self-pay | Admitting: Family Medicine

## 2022-07-05 ENCOUNTER — Other Ambulatory Visit: Payer: Self-pay | Admitting: Family Medicine

## 2022-07-09 ENCOUNTER — Other Ambulatory Visit: Payer: Self-pay | Admitting: Family Medicine

## 2022-07-12 NOTE — Telephone Encounter (Signed)
Last refill: 09/28/21 #180, 3 Last OV: 06/15/21 dx. Hidradenitis

## 2022-07-20 ENCOUNTER — Encounter: Payer: Self-pay | Admitting: Family Medicine

## 2022-07-20 ENCOUNTER — Telehealth (INDEPENDENT_AMBULATORY_CARE_PROVIDER_SITE_OTHER): Payer: Self-pay | Admitting: Family Medicine

## 2022-07-20 DIAGNOSIS — F321 Major depressive disorder, single episode, moderate: Secondary | ICD-10-CM

## 2022-07-20 DIAGNOSIS — F410 Panic disorder [episodic paroxysmal anxiety] without agoraphobia: Secondary | ICD-10-CM

## 2022-07-20 DIAGNOSIS — E7800 Pure hypercholesterolemia, unspecified: Secondary | ICD-10-CM

## 2022-07-20 DIAGNOSIS — L732 Hidradenitis suppurativa: Secondary | ICD-10-CM

## 2022-07-20 MED ORDER — DIAZEPAM 10 MG PO TABS: 5.0000 mg | ORAL_TABLET | Freq: Four times a day (QID) | ORAL | 3 refills | Status: DC | PRN

## 2022-07-20 NOTE — Progress Notes (Signed)
   Jim Hatfield is a 52 y.o. male who presents today for a virtual office visit.  Assessment/Plan:  New/Acute Problems: Xanthelasma  Reassured patient that his lesions are benign however we do need to have him to come in to check labs including cholesterol levels.  He stated he was schedule appointment soon for CPE once he gets back on health insurance.  Chronic Problems Addressed Today: Panic disorder He has been under more stress recently due to life stressors with losing his job.  Overall feels like he is managing this okay.  He is currently trying to find a new job.  He is on Lexapro 20 mg daily and Valium 5 mg every 6 hours as needed.  Will refill his medications today.  He is interested in seeing a therapist at some point however he would like to wait until he gets insurance to do this.  No reported SI or HI today.  He will follow-up with me in 6 months or so.  Depression, major, single episode, moderate (HCC) On Lexapro 20 mg daily.  Will refill today.  No reported SI or HI.  Hypercholesteremia He will come back soon for CPE and we can recheck lipids at that time.  Hidradenitis Stable.  No recent flares.  He does have clindamycin to use as needed.  He is also been using topical salicylic acid which works well.     Subjective:  HPI:  See Assessment / plan for status of chronic conditions.  She is here today for follow-up.  Needs refill on medications.  Was last seen here over a year ago.  He has been doing well until last 6 months ago when he lost his job.  Obviously this has been a large source of stress for him overall does feel like he is managing well though does note anxiety has been higher.  Currently on Lexapro 20 mg daily and tolerating well.  Also uses the Valium 5 mg every 6 hours as needed.  Last refill on this was about 6 months ago.   Is also had some lesions on upper eyelids previously been present for the last several months.      Objective/Observations   Physical Exam: Gen: NAD, resting comfortably Pulm: Normal work of breathing Skin: Xanthelasma on upper eyelids. Neuro: Grossly normal, moves all extremities Psych: Normal affect and thought content  Virtual Visit via Video   I connected with Carolyne Fiscal on 07/20/22 at  2:20 PM EDT by a video enabled telemedicine application and verified that I am speaking with the correct person using two identifiers. The limitations of evaluation and management by telemedicine and the availability of in person appointments were discussed. The patient expressed understanding and agreed to proceed.   Patient location: Home Provider location: [place] [facility] [employer] Persons participating in the virtual visit: Myself and Patient     Katina Degree. [name], MD 07/20/2022 3:07 PM

## 2022-07-20 NOTE — Assessment & Plan Note (Signed)
Stable.  No recent flares.  He does have clindamycin to use as needed.  He is also been using topical salicylic acid which works well.

## 2022-07-20 NOTE — Assessment & Plan Note (Signed)
He will come back soon for CPE and we can recheck lipids at that time.

## 2022-07-20 NOTE — Assessment & Plan Note (Signed)
He has been under more stress recently due to life stressors with losing his job.  Overall feels like he is managing this okay.  He is currently trying to find a new job.  He is on Lexapro 20 mg daily and Valium 5 mg every 6 hours as needed.  Will refill his medications today.  He is interested in seeing a therapist at some point however he would like to wait until he gets insurance to do this.  No reported SI or HI today.  He will follow-up with me in 6 months or so.

## 2022-07-20 NOTE — Assessment & Plan Note (Signed)
On Lexapro 20 mg daily.  Will refill today.  No reported SI or HI.

## 2023-04-02 ENCOUNTER — Other Ambulatory Visit: Payer: Self-pay | Admitting: Family Medicine

## 2023-04-03 ENCOUNTER — Other Ambulatory Visit: Payer: Self-pay | Admitting: Family Medicine

## 2023-04-04 ENCOUNTER — Other Ambulatory Visit: Payer: Self-pay | Admitting: Family Medicine

## 2023-04-04 NOTE — Telephone Encounter (Signed)
Pharmacy comment: Alternative Requested:PLEASE AUTHORIZE A SCRIPT FOR THE 20MG  TAB, INSURANCE WILL ONLY PAY FOR A MAX OF 1 TAB PER DAY. THANKS.

## 2023-04-25 ENCOUNTER — Encounter: Payer: Self-pay | Admitting: Family Medicine

## 2023-04-25 NOTE — Telephone Encounter (Signed)
 Please schedule an office visit with Dr Daneil Dunker tomorrow  For assessment and treatment on temperature

## 2023-04-26 ENCOUNTER — Ambulatory Visit: Payer: Self-pay | Admitting: Family Medicine

## 2023-10-03 ENCOUNTER — Other Ambulatory Visit: Payer: Self-pay | Admitting: Family Medicine

## 2023-12-20 ENCOUNTER — Encounter: Payer: Self-pay | Admitting: Family Medicine

## 2023-12-20 ENCOUNTER — Ambulatory Visit: Admitting: Family Medicine

## 2023-12-20 ENCOUNTER — Ambulatory Visit: Payer: Self-pay | Admitting: Family Medicine

## 2023-12-20 ENCOUNTER — Telehealth: Payer: Self-pay | Admitting: *Deleted

## 2023-12-20 VITALS — BP 132/88 | HR 86 | Temp 98.2°F | Ht 73.0 in | Wt 267.8 lb

## 2023-12-20 DIAGNOSIS — Z1211 Encounter for screening for malignant neoplasm of colon: Secondary | ICD-10-CM

## 2023-12-20 DIAGNOSIS — F321 Major depressive disorder, single episode, moderate: Secondary | ICD-10-CM | POA: Diagnosis not present

## 2023-12-20 DIAGNOSIS — Z125 Encounter for screening for malignant neoplasm of prostate: Secondary | ICD-10-CM

## 2023-12-20 DIAGNOSIS — F17200 Nicotine dependence, unspecified, uncomplicated: Secondary | ICD-10-CM

## 2023-12-20 DIAGNOSIS — Z131 Encounter for screening for diabetes mellitus: Secondary | ICD-10-CM

## 2023-12-20 DIAGNOSIS — L732 Hidradenitis suppurativa: Secondary | ICD-10-CM | POA: Diagnosis not present

## 2023-12-20 DIAGNOSIS — E669 Obesity, unspecified: Secondary | ICD-10-CM

## 2023-12-20 DIAGNOSIS — R2232 Localized swelling, mass and lump, left upper limb: Secondary | ICD-10-CM

## 2023-12-20 DIAGNOSIS — F410 Panic disorder [episodic paroxysmal anxiety] without agoraphobia: Secondary | ICD-10-CM

## 2023-12-20 DIAGNOSIS — Z23 Encounter for immunization: Secondary | ICD-10-CM | POA: Diagnosis not present

## 2023-12-20 DIAGNOSIS — E7800 Pure hypercholesterolemia, unspecified: Secondary | ICD-10-CM | POA: Diagnosis not present

## 2023-12-20 LAB — COMPREHENSIVE METABOLIC PANEL WITH GFR
ALT: 28 U/L (ref 0–53)
AST: 16 U/L (ref 0–37)
Albumin: 4.2 g/dL (ref 3.5–5.2)
Alkaline Phosphatase: 80 U/L (ref 39–117)
BUN: 14 mg/dL (ref 6–23)
CO2: 27 meq/L (ref 19–32)
Calcium: 9.4 mg/dL (ref 8.4–10.5)
Chloride: 101 meq/L (ref 96–112)
Creatinine, Ser: 0.86 mg/dL (ref 0.40–1.50)
GFR: 97.59 mL/min
Glucose, Bld: 202 mg/dL — ABNORMAL HIGH (ref 70–99)
Potassium: 4.4 meq/L (ref 3.5–5.1)
Sodium: 137 meq/L (ref 135–145)
Total Bilirubin: 0.5 mg/dL (ref 0.2–1.2)
Total Protein: 6.9 g/dL (ref 6.0–8.3)

## 2023-12-20 LAB — CBC
HCT: 47.1 % (ref 39.0–52.0)
Hemoglobin: 15.7 g/dL (ref 13.0–17.0)
MCHC: 33.3 g/dL (ref 30.0–36.0)
MCV: 90.5 fl (ref 78.0–100.0)
Platelets: 205 K/uL (ref 150.0–400.0)
RBC: 5.2 Mil/uL (ref 4.22–5.81)
RDW: 13.9 % (ref 11.5–15.5)
WBC: 8.2 K/uL (ref 4.0–10.5)

## 2023-12-20 LAB — LIPID PANEL
Cholesterol: 210 mg/dL — ABNORMAL HIGH (ref 0–200)
HDL: 32.8 mg/dL — ABNORMAL LOW (ref >39.00)
LDL Cholesterol: 143 mg/dL — ABNORMAL HIGH (ref 0–99)
NonHDL: 177.05
Total CHOL/HDL Ratio: 6
Triglycerides: 172 mg/dL — ABNORMAL HIGH (ref 0.0–149.0)
VLDL: 34.4 mg/dL (ref 0.0–40.0)

## 2023-12-20 LAB — HEMOGLOBIN A1C: Hgb A1c MFr Bld: 9.4 % — ABNORMAL HIGH (ref 4.6–6.5)

## 2023-12-20 LAB — TSH: TSH: 1.43 u[IU]/mL (ref 0.35–5.50)

## 2023-12-20 LAB — PSA: PSA: 0.39 ng/mL (ref 0.10–4.00)

## 2023-12-20 MED ORDER — PROPRANOLOL HCL ER 120 MG PO CP24: 120.0000 mg | ORAL_CAPSULE | Freq: Every day | ORAL | 3 refills | Status: AC

## 2023-12-20 MED ORDER — ESCITALOPRAM OXALATE 20 MG PO TABS: 20.0000 mg | ORAL_TABLET | Freq: Every day | ORAL | 3 refills | Status: AC

## 2023-12-20 MED ORDER — DIAZEPAM 10 MG PO TABS: 5.0000 mg | ORAL_TABLET | Freq: Four times a day (QID) | ORAL | 1 refills | Status: AC | PRN

## 2023-12-20 MED ORDER — NICOTINE POLACRILEX 2 MG MT LOZG: 2.0000 mg | LOZENGE | OROMUCOSAL | 0 refills | Status: AC | PRN

## 2023-12-20 NOTE — Assessment & Plan Note (Signed)
 See anxiety A/P.  Symptoms stable on Lexapro  20 mg daily.  He will let us  know if he needs referral to see a psychiatrist.

## 2023-12-20 NOTE — Assessment & Plan Note (Signed)
 Patient was asked about his tobacco use today and was strongly advised to quit. Patient is currently contemplative. We reviewed treatment options to assist him quit smoking including NRT, Chantix, and Bupropion.  He has not done well with Chantix or bupropion in the past.  Will try nicotine lozenges follow up at next office visit.   Total time spent counseling approximately 3 minutes.

## 2023-12-20 NOTE — Patient Instructions (Addendum)
 It was very nice to see you today!  VISIT SUMMARY: During your visit, we discussed your concerns about anxiety, smoking cessation, and hidradenitis suppurativa. We also addressed your weight, cholesterol levels, and the need for certain vaccinations.  YOUR PLAN: HIDRADENITIS SUPPURATIVA: This chronic skin condition has flared up, causing significant pain and affecting your daily activities. -Continue using clindamycin  cream as needed. -Consider consulting a plastic surgeon if the condition worsens.  NICOTINE DEPENDENCE, CIGARETTES: You have increased your smoking to two packs per day and have had adverse reactions to previous cessation aids. -Start using 2 mg nicotine lozenges. -Gradually reduce cigarette use while using the lozenges.  PANIC DISORDER AND MAJOR DEPRESSIVE DISORDER: Your high-stress job is contributing to anxiety -Consider a psychiatric evaluation  OBESITY: Your weight is stable despite a poor diet and sedentary lifestyle. -Encourage healthier eating habits and increased physical activity as tolerated.  HYPERCHOLESTEROLEMIA: We discussed the role of fish oil in managing cholesterol and the need for blood work to assess current levels. -Order blood work to check cholesterol levels and A1c.  BENIGN SKIN CYSTS: The cysts are causing itching and discomfort. -Consider referral to a general surgeon for cyst removal if symptoms worsen.  IMMUNIZATIONS: We discussed the importance of shingles and pneumonia vaccines. -Administer the shingles and pneumonia vaccines today.  Return if symptoms worsen or fail to improve.   Take care, Dr Kennyth  PLEASE NOTE:  If you had any lab tests, please let us  know if you have not heard back within a few days. You may see your results on mychart before we have a chance to review them but we will give you a call once they are reviewed by us .   If we ordered any referrals today, please let us  know if you have not heard from their office  within the next week.   If you had any urgent prescriptions sent in today, please check with the pharmacy within an hour of our visit to make sure the prescription was transmitted appropriately.   Please try these tips to maintain a healthy lifestyle:  Eat at least 3 REAL meals and 1-2 snacks per day.  Aim for no more than 5 hours between eating.  If you eat breakfast, please do so within one hour of getting up.   Each meal should contain half fruits/vegetables, one quarter protein, and one quarter carbs (no bigger than a computer mouse)  Cut down on sweet beverages. This includes juice, soda, and sweet tea.   Drink at least 1 glass of water with each meal and aim for at least 8 glasses per day  Exercise at least 150 minutes every week.

## 2023-12-20 NOTE — Assessment & Plan Note (Signed)
 Symptoms are currently not controlled.  He has clindamycin  gel to use as needed.  He has previously seen plastic surgery for this.  We did discuss trial of doxycycline  however he has not found it to be helpful in the past.  Does not currently have any abscesses.  Does not need refill on clindamycin  today.  He will follow-up with plastic surgery as needed.

## 2023-12-20 NOTE — Assessment & Plan Note (Signed)
 Check labs

## 2023-12-20 NOTE — Progress Notes (Signed)
 His A1c is elevated and into the diabetic range.  I would like for him to come back to discuss treatment options for this.  We can also do this is a virtual visit.  It is important that we get this under control to prevent future complications.  His cholesterol level is also elevated.  It would be a good idea for him to start a cholesterol medication.  We can discuss this at his office visit as well however if he prefers to go ahead and get started we can send in Lipitor 40 mg daily.  We should recheck this in 6 to 12 months.  All of his other labs are at goal and we can recheck again in a year.

## 2023-12-20 NOTE — Assessment & Plan Note (Signed)
 Overall symptoms are currently manageable on Lexapro  20 mg daily and Valium  5 mg every 6 hours as needed.  Will refill today.  He has had significant issues in the past including severe flareups that have limited his ability to work.  Will continue his current regimen for now.  He will let us  know if he needs any further assistance including psychiatry referral.

## 2023-12-20 NOTE — Telephone Encounter (Signed)
 Copied from CRM [id number]. Topic: Clinical - Medical Advice >> Dec 20, 2023  1:41 PM Lauren C wrote: Reason for CRM: Pt was told he needed to have a follow up OV to discuss his labs that came back today in the diabetic range and to go over treatment options. Next avail with Dr. Kennyth was scheduled for 10/13. Pt wants to know if it will be okay to wait that long or if there is anything that can be done sooner, states he lives 2 streets away and is [facility] so he can come any time. Pt is on the wait list for 10/13 apt. Please let him know if it is okay to wait that long or if okay to schedule acute with another provider. [phone] (Mobile)   Patient has a F/U appt with PCP on 12/26/2023 [facility]

## 2023-12-20 NOTE — Assessment & Plan Note (Signed)
 BMI 35 with comorbidities.  Check labs today.  Discussed lifestyle modifications though patient does admit that this is challenging due to his hidradenitis and anxiety.

## 2023-12-20 NOTE — Progress Notes (Signed)
 Jim Hatfield is a 53 y.o. male who presents today for an office visit.  Assessment/Plan:  New/Acute Problems: Skin Lump No red flags. Consistent with lipoma.  Discussed referral to general surgery for excision however he declined.  He will let us  know if he changes mind.  Chronic Problems Addressed Today: Panic disorder Overall symptoms are currently manageable on Lexapro  20 mg daily and Valium  5 mg every 6 hours as needed.  Will refill today.  He has had significant issues in the past including severe flareups that have limited his ability to work.  Will continue his current regimen for now.  He will let us  know if he needs any further assistance including psychiatry referral.  Nicotine dependence with current use Patient was asked about his tobacco use today and was strongly advised to quit. Patient is currently contemplative. We reviewed treatment options to assist him quit smoking including NRT, Chantix, and Bupropion.  He has not done well with Chantix or bupropion in the past.  Will try nicotine lozenges follow up at next office visit.   Total time spent counseling approximately 3 minutes.    Depression, major, single episode, moderate (HCC) See anxiety A/P.  Symptoms stable on Lexapro  20 mg daily.  He will let us  know if he needs referral to see a psychiatrist.  Hidradenitis Symptoms are currently not controlled.  He has clindamycin  gel to use as needed.  He has previously seen plastic surgery for this.  We did discuss trial of doxycycline  however he has not found it to be helpful in the past.  Does not currently have any abscesses.  Does not need refill on clindamycin  today.  He will follow-up with plastic surgery as needed.   Hypercholesteremia Check labs.  Obesity BMI 35 with comorbidities.  Check labs today.  Discussed lifestyle modifications though patient does admit that this is challenging due to his hidradenitis and anxiety.  Flu, pneumonia, and shingles vaccine  given today.  Will order colonoscopy  referral today as well.    Subjective:  HPI:  See assessment / plan for status of chronic conditions.   Discussed the use of AI scribe software for clinical note transcription with the patient, who gave verbal consent to proceed.  History of Present Illness Jim Hatfield is a 53 year old male who presents with concerns about anxiety, smoking cessation, and hidradenitis suppurativa.  He smokes two or more packs of cigarettes a day, a significant increase from his previous quarter-pack a day habit. He has tried nicotine patches and Chantix in the past but experienced adverse effects such as rapid heartbeat and sleepwalking, respectively. He is interested in trying nicotine lozenges as a smoking cessation aid due to concerns about the impact of smoking on his health.  He has a history of hidradenitis suppurativa, which has been flaring up for the past three to four months. The lesions develop rapidly, are painful, and affect his ability to walk and perform daily activities. He has used antibiotics and pain medication in the past but finds them only partially effective. Clindamycin  cream is sometimes helpful depending on the stage of the lesions.  He has a history of diverticulosis and has previously undergone a colonoscopy. He experiences anxiety about scheduling another colonoscopy due to the anticipation and waiting involved in the process.  He mentions a sedentary lifestyle due to working from home and the impact of his hidradenitis suppurativa on his ability to exercise. He is concerned about his weight and overall health,  noting that he does not eat well due to financial constraints and anxiety about grocery shopping.  He is interested in receiving the shingles and pneumonia vaccines due to concerns about the potential severity of these illnesses.         Objective:  Physical Exam: BP 132/88   Pulse 86   Temp 98.2 F (36.8 C) (Temporal)   Ht  6' 1 (1.854 m)   Wt 267 lb 12.8 oz (121.5 kg)   SpO2 96%   BMI 35.33 kg/m   Gen: No acute distress, resting comfortably CV: Regular rate and rhythm with no murmurs appreciated Pulm: Normal work of breathing, clear to auscultation bilaterally with no crackles, wheezes, or rhonchi Skin: 2cm mobile mass on left upper arm.  Neuro: Grossly normal, moves all extremities Psych: Normal affect and thought content  Time Spent: 40 minutes of total time was spent on the date of the encounter performing the following actions: chart review prior to seeing the patient, obtaining history, performing a medically necessary exam, counseling on the treatment plan, placing orders, and documenting in our EHR. This does not include time spent on smoking cessation.        Worth HERO. Kennyth, MD 12/20/2023 10:01 AM

## 2023-12-26 ENCOUNTER — Ambulatory Visit: Admitting: Family Medicine

## 2023-12-26 ENCOUNTER — Encounter: Payer: Self-pay | Admitting: Family Medicine

## 2023-12-26 VITALS — BP 140/82 | HR 85 | Temp 97.5°F | Ht 73.0 in | Wt 270.4 lb

## 2023-12-26 DIAGNOSIS — E1165 Type 2 diabetes mellitus with hyperglycemia: Secondary | ICD-10-CM

## 2023-12-26 DIAGNOSIS — Z7985 Long-term (current) use of injectable non-insulin antidiabetic drugs: Secondary | ICD-10-CM

## 2023-12-26 DIAGNOSIS — H0260 Xanthelasma of unspecified eye, unspecified eyelid: Secondary | ICD-10-CM | POA: Insufficient documentation

## 2023-12-26 DIAGNOSIS — E1169 Type 2 diabetes mellitus with other specified complication: Secondary | ICD-10-CM

## 2023-12-26 DIAGNOSIS — E785 Hyperlipidemia, unspecified: Secondary | ICD-10-CM

## 2023-12-26 DIAGNOSIS — E119 Type 2 diabetes mellitus without complications: Secondary | ICD-10-CM | POA: Insufficient documentation

## 2023-12-26 MED ORDER — TIRZEPATIDE 2.5 MG/0.5ML ~~LOC~~ SOAJ: 2.5000 mg | SUBCUTANEOUS | 0 refills | Status: DC

## 2023-12-26 MED ORDER — TIRZEPATIDE 5 MG/0.5ML ~~LOC~~ SOAJ: 5.0000 mg | SUBCUTANEOUS | 0 refills | Status: DC

## 2023-12-26 NOTE — Assessment & Plan Note (Signed)
 Also discussed most recent lipid panel which was notable for LDL of 143.  We discussed starting Lipitor however he like to hold off on this for now.  Will recheck lipids in 3 to 6 months.  Hopefully he will have significant amount of weight loss with starting Mounjaro as above.

## 2023-12-26 NOTE — Progress Notes (Signed)
   Jim Hatfield is a 53 y.o. male who presents today for an office visit.  Assessment/Plan:   Chronic Problems Addressed Today: T2DM (type 2 diabetes mellitus) (HCC) I had lengthy discussion with patient today regarding most recent A1c of 9.4.  We discussed lifestyle interventions.  Discussed medication options.  We will start Mounjaro 2.5 mg weekly for 4 weeks and then increase to 5 mg weekly.  We discussed potential side effects.  Will recheck A1c in 3 months.  Dyslipidemia associated with type 2 diabetes mellitus (HCC) Also discussed most recent lipid panel which was notable for LDL of 143.  We discussed starting Lipitor however he like to hold off on this for now.  Will recheck lipids in 3 to 6 months.  Hopefully he will have significant amount of weight loss with starting Mounjaro as above.  Xanthelasma Patient with xanthelasma on right upper eyelid.  May have some improvement with the above intervention for his dyslipidemia and diabetes.  We discussed referral to oculoplastic for excision however he will hold off on this for now.     Subjective:  HPI:  See assessment / plan for status of chronic conditions.   Discussed the use of AI scribe software for clinical note transcription with the patient, who gave verbal consent to proceed.  History of Present Illness [name] is a 53 year old male who presents for follow-up on elevated blood glucose and A1c levels.  He is concerned about his A1c being in the diabetic range. He has a family history of diabetes, as his father had diabetes. He is cautious about medications affecting his heart. He is open to starting medication for his blood sugar but wants to ensure it does not exacerbate his heart condition. He is also considering lifestyle changes such as diet and exercise.  He mentions a family history of pancreatic cancer, as his father died from it. He is concerned about cholesterol levels, which were mildly elevated, and is  currently not taking Lipitor to avoid potential increases in blood sugar.  His current medication regimen includes fish oil, and he is awaiting lozenges from the pharmacy. He is interested in starting a new injectable medication for his blood sugar.         Objective:  Physical Exam: BP (!) 160/99   Pulse 85   Temp (!) 97.5 F (36.4 C) (Temporal)   Ht 6' 1 (1.854 m)   Wt 270 lb 6.4 oz (122.7 kg)   SpO2 97%   BMI 35.67 kg/m   Gen: No acute distress, resting comfortably HEENT: Xanthelasma right upper eyelid Neuro: Grossly normal, moves all extremities Psych: Normal affect and thought content      Damon Baisch M. Kennyth, MD 12/26/2023 8:30 AM

## 2023-12-26 NOTE — Patient Instructions (Signed)
 It was very nice to see you today!  VISIT SUMMARY: Today, we discussed your elevated blood glucose and A1c levels, confirming a diagnosis of type 2 diabetes. We also reviewed your concerns about cholesterol levels and cholesterol deposits around your eyes.  YOUR PLAN: TYPE 2 DIABETES MELLITUS: Your A1c levels confirm type 2 diabetes, which needs to be managed to prevent complications. -Start Mounjaro 2.5 mg weekly for 4 weeks, then increase to 5 mg weekly. -Be aware of potential side effects like nausea. -Monitor your blood glucose levels and adjust treatment as needed. -Focus on diet and exercise to help manage your blood sugar. -Schedule a follow-up in a few weeks to assess how you are tolerating Mounjaro and its effectiveness. -Schedule another follow-up in 3 months to recheck your A1c levels.  HYPERCHOLESTEROLEMIA: Your cholesterol levels are mildly elevated. -Focus on controlling your blood glucose first. -Weight loss and dietary changes may help improve your cholesterol levels. -Hold off on taking Lipitor for now. -Monitor your cholesterol levels and reassess next year.  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 -Encourage weight loss to help reduce the deposits. -Consider oculoplastic surgery if there is no improvement with weight loss.  Return in about 3 months (around 03/27/2024) for Follow Up.   Take care, Dr Kennyth  PLEASE NOTE:  If you had any lab tests, please let us  know if you have not heard back within a few days. You may see your results on mychart before we have a chance to review them but we will give you a call once they are reviewed by us .   If we ordered any referrals today, please let us  know if you have not heard from their office within the next week.   If you had any urgent prescriptions sent in today, please check with the pharmacy within an hour of our visit to make sure the prescription was  transmitted appropriately.   Please try these tips to maintain a healthy lifestyle:  Eat at least 3 REAL meals and 1-2 snacks per day.  Aim for no more than 5 hours between eating.  If you eat breakfast, please do so within one hour of getting up.   Each meal should contain half fruits/vegetables, one quarter protein, and one quarter carbs (no bigger than a computer mouse)  Cut down on sweet beverages. This includes juice, soda, and sweet tea.   Drink at least 1 glass of water with each meal and aim for at least 8 glasses per day  Exercise at least 150 minutes every week.

## 2023-12-26 NOTE — Assessment & Plan Note (Signed)
 I had lengthy discussion with patient today regarding most recent A1c of 9.4.  We discussed lifestyle interventions.  Discussed medication options.  We will start Mounjaro 2.5 mg weekly for 4 weeks and then increase to 5 mg weekly.  We discussed potential side effects.  Will recheck A1c in 3 months.

## 2023-12-26 NOTE — Assessment & Plan Note (Signed)
 Patient with xanthelasma on right upper eyelid.  May have some improvement with the above intervention for his dyslipidemia and diabetes.  We discussed referral to oculoplastic for excision however he will hold off on this for now.

## 2023-12-29 ENCOUNTER — Other Ambulatory Visit (HOSPITAL_COMMUNITY): Payer: Self-pay

## 2023-12-29 ENCOUNTER — Telehealth: Payer: Self-pay

## 2023-12-29 NOTE — Telephone Encounter (Signed)
 Pharmacy Patient Advocate Encounter   Received notification from Onbase that prior authorization for Mounjaro 2.5 mg/0.5 ml auto injectors is required/requested.   Insurance verification completed.   The patient is insured through [employer].   Per test claim: PA required; PA submitted to above mentioned insurance via Fax Key/confirmation #[id number] Status is pending *faxed to [phone]

## 2024-02-06 ENCOUNTER — Ambulatory Visit: Admitting: Family Medicine

## 2024-02-21 ENCOUNTER — Other Ambulatory Visit (HOSPITAL_COMMUNITY): Payer: Self-pay

## 2024-02-21 ENCOUNTER — Ambulatory Visit

## 2024-02-21 ENCOUNTER — Other Ambulatory Visit: Payer: Self-pay | Admitting: *Deleted

## 2024-02-21 ENCOUNTER — Telehealth: Payer: Self-pay | Admitting: *Deleted

## 2024-02-21 DIAGNOSIS — Z23 Encounter for immunization: Secondary | ICD-10-CM

## 2024-02-21 MED ORDER — TIRZEPATIDE 2.5 MG/0.5ML ~~LOC~~ SOAJ: 2.5000 mg | SUBCUTANEOUS | 0 refills | Status: AC

## 2024-02-21 NOTE — Telephone Encounter (Signed)
 New Rx Mounjaro  send to pharmacy

## 2024-02-21 NOTE — Telephone Encounter (Signed)
 Patient will like information for PA Rx Monunjaro

## 2024-02-21 NOTE — Progress Notes (Signed)
 Patient is in office today for a nurse visit for Immunization. Patient Injection was given in the  Left deltoid. Patient tolerated injection well.

## 2024-02-22 ENCOUNTER — Other Ambulatory Visit (HOSPITAL_COMMUNITY): Payer: Self-pay

## 2024-02-27 ENCOUNTER — Other Ambulatory Visit (HOSPITAL_COMMUNITY): Payer: Self-pay

## 2024-02-28 NOTE — Telephone Encounter (Signed)
 Patient notified Rx Mounjaro  denied by insurance  Advise to contact insurance for alternatives  Verbalized understanding

## 2024-04-05 ENCOUNTER — Encounter: Payer: Self-pay | Admitting: Internal Medicine
